# Patient Record
Sex: Male | Born: 1940
Health system: Southern US, Community
[De-identification: ages and names within clinical notes are randomized; demographics above are authoritative.]

## PROBLEM LIST (undated history)

## (undated) DIAGNOSIS — E213 Hyperparathyroidism, unspecified: Secondary | ICD-10-CM

## (undated) DIAGNOSIS — J189 Pneumonia, unspecified organism: Secondary | ICD-10-CM

## (undated) DIAGNOSIS — I2510 Atherosclerotic heart disease of native coronary artery without angina pectoris: Secondary | ICD-10-CM

## (undated) DIAGNOSIS — R0600 Dyspnea, unspecified: Secondary | ICD-10-CM

## (undated) DIAGNOSIS — C61 Malignant neoplasm of prostate: Secondary | ICD-10-CM

## (undated) DIAGNOSIS — M1990 Unspecified osteoarthritis, unspecified site: Secondary | ICD-10-CM

## (undated) DIAGNOSIS — I209 Angina pectoris, unspecified: Secondary | ICD-10-CM

## (undated) DIAGNOSIS — H409 Unspecified glaucoma: Secondary | ICD-10-CM

## (undated) DIAGNOSIS — B54 Unspecified malaria: Secondary | ICD-10-CM

## (undated) DIAGNOSIS — I10 Essential (primary) hypertension: Secondary | ICD-10-CM

## (undated) DIAGNOSIS — I499 Cardiac arrhythmia, unspecified: Secondary | ICD-10-CM

## (undated) DIAGNOSIS — R0609 Other forms of dyspnea: Secondary | ICD-10-CM

## (undated) DIAGNOSIS — K219 Gastro-esophageal reflux disease without esophagitis: Secondary | ICD-10-CM

## (undated) DIAGNOSIS — N189 Chronic kidney disease, unspecified: Secondary | ICD-10-CM

## (undated) HISTORY — PX: MOHS SURGERY: SUR867

## (undated) HISTORY — PX: OTHER SURGICAL HISTORY: SHX169

## (undated) HISTORY — PX: PROSTATE BIOPSY: SHX241

---

## 1967-05-21 DIAGNOSIS — B54 Unspecified malaria: Secondary | ICD-10-CM

## 1967-05-21 HISTORY — DX: Unspecified malaria: B54

## 1980-05-20 HISTORY — PX: VASECTOMY: SHX75

## 2000-06-28 ENCOUNTER — Encounter: Admission: RE | Admit: 2000-06-28 | Discharge: 2000-06-28 | Payer: Self-pay | Admitting: Emergency Medicine

## 2000-06-28 ENCOUNTER — Encounter: Payer: Self-pay | Admitting: Emergency Medicine

## 2004-08-03 ENCOUNTER — Encounter: Admission: RE | Admit: 2004-08-03 | Discharge: 2004-08-03 | Payer: Self-pay | Admitting: Emergency Medicine

## 2005-08-28 ENCOUNTER — Encounter: Admission: RE | Admit: 2005-08-28 | Discharge: 2005-08-28 | Payer: Self-pay | Admitting: Emergency Medicine

## 2008-05-20 DIAGNOSIS — C61 Malignant neoplasm of prostate: Secondary | ICD-10-CM

## 2008-05-20 HISTORY — DX: Malignant neoplasm of prostate: C61

## 2011-09-18 HISTORY — PX: OTHER SURGICAL HISTORY: SHX169

## 2011-09-25 DIAGNOSIS — E7800 Pure hypercholesterolemia, unspecified: Secondary | ICD-10-CM | POA: Diagnosis not present

## 2011-09-25 DIAGNOSIS — R079 Chest pain, unspecified: Secondary | ICD-10-CM | POA: Diagnosis not present

## 2011-09-25 DIAGNOSIS — R002 Palpitations: Secondary | ICD-10-CM | POA: Diagnosis not present

## 2011-09-25 DIAGNOSIS — I10 Essential (primary) hypertension: Secondary | ICD-10-CM | POA: Diagnosis not present

## 2011-10-09 DIAGNOSIS — R9430 Abnormal result of cardiovascular function study, unspecified: Secondary | ICD-10-CM | POA: Diagnosis not present

## 2011-10-15 ENCOUNTER — Other Ambulatory Visit: Payer: Self-pay | Admitting: Cardiovascular Disease

## 2011-10-17 ENCOUNTER — Other Ambulatory Visit: Payer: Self-pay | Admitting: Cardiovascular Disease

## 2011-10-17 ENCOUNTER — Ambulatory Visit
Admission: RE | Admit: 2011-10-17 | Discharge: 2011-10-17 | Disposition: A | Discharge status: Home / Self Care | Payer: Medicare Other | Source: Ambulatory Visit | Attending: Cardiovascular Disease | Admitting: Cardiovascular Disease

## 2011-10-17 ENCOUNTER — Encounter (HOSPITAL_COMMUNITY): Payer: Self-pay | Admitting: Pharmacy Technician

## 2011-10-17 DIAGNOSIS — R5383 Other fatigue: Secondary | ICD-10-CM | POA: Diagnosis not present

## 2011-10-17 DIAGNOSIS — Z01818 Encounter for other preprocedural examination: Secondary | ICD-10-CM

## 2011-10-17 DIAGNOSIS — D689 Coagulation defect, unspecified: Secondary | ICD-10-CM | POA: Diagnosis not present

## 2011-10-17 DIAGNOSIS — Z01811 Encounter for preprocedural respiratory examination: Secondary | ICD-10-CM | POA: Diagnosis not present

## 2011-10-17 DIAGNOSIS — R0789 Other chest pain: Secondary | ICD-10-CM | POA: Diagnosis not present

## 2011-10-17 DIAGNOSIS — Z79899 Other long term (current) drug therapy: Secondary | ICD-10-CM | POA: Diagnosis not present

## 2011-10-17 DIAGNOSIS — I517 Cardiomegaly: Secondary | ICD-10-CM | POA: Diagnosis not present

## 2011-10-23 ENCOUNTER — Encounter (HOSPITAL_COMMUNITY)
Admission: RE | Disposition: A | Discharge status: Home / Self Care | Payer: Self-pay | Source: Ambulatory Visit | Attending: Cardiovascular Disease

## 2011-10-23 ENCOUNTER — Encounter (HOSPITAL_COMMUNITY): Payer: Self-pay | Admitting: General Practice

## 2011-10-23 ENCOUNTER — Ambulatory Visit (HOSPITAL_COMMUNITY)
Admission: RE | Admit: 2011-10-23 | Discharge: 2011-10-24 | Disposition: A | Discharge status: Home / Self Care | Payer: Medicare Other | Source: Ambulatory Visit | Attending: Cardiovascular Disease | Admitting: Cardiovascular Disease

## 2011-10-23 DIAGNOSIS — R9430 Abnormal result of cardiovascular function study, unspecified: Secondary | ICD-10-CM | POA: Diagnosis not present

## 2011-10-23 DIAGNOSIS — Z9861 Coronary angioplasty status: Secondary | ICD-10-CM | POA: Diagnosis not present

## 2011-10-23 DIAGNOSIS — I200 Unstable angina: Secondary | ICD-10-CM | POA: Diagnosis present

## 2011-10-23 DIAGNOSIS — N400 Enlarged prostate without lower urinary tract symptoms: Secondary | ICD-10-CM | POA: Diagnosis present

## 2011-10-23 DIAGNOSIS — Z95820 Peripheral vascular angioplasty status with implants and grafts: Secondary | ICD-10-CM

## 2011-10-23 DIAGNOSIS — I2510 Atherosclerotic heart disease of native coronary artery without angina pectoris: Secondary | ICD-10-CM | POA: Diagnosis present

## 2011-10-23 DIAGNOSIS — E785 Hyperlipidemia, unspecified: Secondary | ICD-10-CM | POA: Diagnosis present

## 2011-10-23 DIAGNOSIS — I209 Angina pectoris, unspecified: Secondary | ICD-10-CM | POA: Diagnosis not present

## 2011-10-23 DIAGNOSIS — I10 Essential (primary) hypertension: Secondary | ICD-10-CM | POA: Diagnosis present

## 2011-10-23 DIAGNOSIS — R931 Abnormal findings on diagnostic imaging of heart and coronary circulation: Secondary | ICD-10-CM | POA: Diagnosis present

## 2011-10-23 HISTORY — DX: Other forms of dyspnea: R06.09

## 2011-10-23 HISTORY — DX: Pneumonia, unspecified organism: J18.9

## 2011-10-23 HISTORY — PX: CORONARY ANGIOPLASTY WITH STENT PLACEMENT: SHX49

## 2011-10-23 HISTORY — DX: Angina pectoris, unspecified: I20.9

## 2011-10-23 HISTORY — DX: Atherosclerotic heart disease of native coronary artery without angina pectoris: I25.10

## 2011-10-23 HISTORY — DX: Dyspnea, unspecified: R06.00

## 2011-10-23 HISTORY — DX: Unspecified glaucoma: H40.9

## 2011-10-23 HISTORY — DX: Essential (primary) hypertension: I10

## 2011-10-23 HISTORY — DX: Hyperparathyroidism, unspecified: E21.3

## 2011-10-23 HISTORY — DX: Malignant neoplasm of prostate: C61

## 2011-10-23 HISTORY — PX: LEFT HEART CATHETERIZATION WITH CORONARY ANGIOGRAM: SHX5451

## 2011-10-23 HISTORY — DX: Unspecified malaria: B54

## 2011-10-23 SURGERY — LEFT HEART CATHETERIZATION WITH CORONARY ANGIOGRAM
Anesthesia: LOCAL

## 2011-10-23 MED ORDER — HYDROCORTISONE 1 % EX CREA
TOPICAL_CREAM | Freq: Four times a day (QID) | CUTANEOUS | Status: DC
  Administered 2011-10-23 – 2011-10-24 (×3): via TOPICAL
  Filled 2011-10-23 (×2): qty 28

## 2011-10-23 MED ORDER — AMLODIPINE BESYLATE 2.5 MG PO TABS: 2.5000 mg | ORAL_TABLET | Freq: Every day | ORAL | Status: DC

## 2011-10-23 MED ORDER — SODIUM CHLORIDE 0.9 % IV SOLN: 250.0000 mL | INTRAVENOUS | Status: DC

## 2011-10-23 MED ORDER — BIVALIRUDIN 250 MG IV SOLR
INTRAVENOUS | Status: AC
  Filled 2011-10-23: qty 250

## 2011-10-23 MED ORDER — METOPROLOL TARTRATE 25 MG PO TABS
25.0000 mg | ORAL_TABLET | Freq: Two times a day (BID) | ORAL | Status: DC
  Administered 2011-10-23 – 2011-10-24 (×2): 25 mg via ORAL
  Filled 2011-10-23 (×3): qty 1

## 2011-10-23 MED ORDER — SODIUM CHLORIDE 0.9 % IV SOLN
INTRAVENOUS | Status: DC
  Administered 2011-10-23: 09:00:00 via INTRAVENOUS

## 2011-10-23 MED ORDER — ATORVASTATIN CALCIUM 40 MG PO TABS
40.0000 mg | ORAL_TABLET | Freq: Every day | ORAL | Status: DC
  Administered 2011-10-23: 40 mg via ORAL
  Filled 2011-10-23 (×3): qty 1

## 2011-10-23 MED ORDER — DIPHENHYDRAMINE HCL 25 MG PO CAPS
25.0000 mg | ORAL_CAPSULE | Freq: Four times a day (QID) | ORAL | Status: DC | PRN
  Administered 2011-10-24: 25 mg via ORAL
  Filled 2011-10-23: qty 1

## 2011-10-23 MED ORDER — SODIUM CHLORIDE 0.9 % IJ SOLN: 3.0000 mL | INTRAMUSCULAR | Status: DC | PRN

## 2011-10-23 MED ORDER — LIDOCAINE HCL (PF) 1 % IJ SOLN
INTRAMUSCULAR | Status: AC
  Filled 2011-10-23: qty 30

## 2011-10-23 MED ORDER — TRAMADOL HCL 50 MG PO TABS
50.0000 mg | ORAL_TABLET | Freq: Four times a day (QID) | ORAL | Status: DC | PRN
  Filled 2011-10-23: qty 1

## 2011-10-23 MED ORDER — TICAGRELOR 90 MG PO TABS
ORAL_TABLET | ORAL | Status: AC
  Filled 2011-10-23: qty 1

## 2011-10-23 MED ORDER — SODIUM CHLORIDE 0.9 % IV SOLN
1.0000 mL/kg/h | INTRAVENOUS | Status: AC
  Administered 2011-10-23: 1 mL/kg/h via INTRAVENOUS

## 2011-10-23 MED ORDER — CLONIDINE HCL 0.1 MG PO TABS
0.1000 mg | ORAL_TABLET | Freq: Once | ORAL | Status: AC
  Administered 2011-10-23: 0.1 mg via ORAL
  Filled 2011-10-23 (×2): qty 1

## 2011-10-23 MED ORDER — FINASTERIDE 5 MG PO TABS
5.0000 mg | ORAL_TABLET | Freq: Every day | ORAL | Status: DC
  Administered 2011-10-24: 5 mg via ORAL
  Filled 2011-10-23: qty 1

## 2011-10-23 MED ORDER — ALPRAZOLAM 0.25 MG PO TABS
0.2500 mg | ORAL_TABLET | Freq: Three times a day (TID) | ORAL | Status: DC | PRN
Start: 2011-10-23 — End: 2011-10-24

## 2011-10-23 MED ORDER — LATANOPROST 0.005 % OP SOLN
1.0000 [drp] | Freq: Every day | OPHTHALMIC | Status: DC
  Administered 2011-10-23: 1 [drp] via OPHTHALMIC
  Filled 2011-10-23: qty 2.5

## 2011-10-23 MED ORDER — AMLODIPINE BESYLATE 5 MG PO TABS
5.0000 mg | ORAL_TABLET | Freq: Every day | ORAL | Status: DC
  Administered 2011-10-24: 5 mg via ORAL
  Filled 2011-10-23: qty 1

## 2011-10-23 MED ORDER — HEPARIN (PORCINE) IN NACL 2-0.9 UNIT/ML-% IJ SOLN
INTRAMUSCULAR | Status: AC
  Filled 2011-10-23: qty 1000

## 2011-10-23 MED ORDER — ASPIRIN 81 MG PO CHEW
81.0000 mg | CHEWABLE_TABLET | Freq: Every day | ORAL | Status: DC
  Administered 2011-10-24: 81 mg via ORAL
  Filled 2011-10-23: qty 1

## 2011-10-23 MED ORDER — SODIUM CHLORIDE 0.9 % IJ SOLN
3.0000 mL | Freq: Two times a day (BID) | INTRAMUSCULAR | Status: DC
  Administered 2011-10-23: 3 mL via INTRAVENOUS

## 2011-10-23 MED ORDER — MORPHINE SULFATE 2 MG/ML IJ SOLN: 2.0000 mg | INTRAMUSCULAR | Status: DC | PRN

## 2011-10-23 MED ORDER — TICAGRELOR 90 MG PO TABS
90.0000 mg | ORAL_TABLET | Freq: Two times a day (BID) | ORAL | Status: DC
  Administered 2011-10-24: 90 mg via ORAL
  Filled 2011-10-23 (×2): qty 1

## 2011-10-23 MED ORDER — METHYLPREDNISOLONE SODIUM SUCC 125 MG IJ SOLR
INTRAMUSCULAR | Status: AC
  Filled 2011-10-23: qty 2

## 2011-10-23 MED ORDER — ZOLPIDEM TARTRATE 5 MG PO TABS: 5.0000 mg | ORAL_TABLET | Freq: Every evening | ORAL | Status: DC | PRN

## 2011-10-23 MED ORDER — ONDANSETRON HCL 4 MG/2ML IJ SOLN: 4.0000 mg | Freq: Four times a day (QID) | INTRAMUSCULAR | Status: DC | PRN

## 2011-10-23 MED ORDER — METHYLPREDNISOLONE SODIUM SUCC 125 MG IJ SOLR
125.0000 mg | Freq: Once | INTRAMUSCULAR | Status: AC
  Administered 2011-10-23: 125 mg via INTRAVENOUS

## 2011-10-23 MED ORDER — ACETAMINOPHEN 325 MG PO TABS: 650.0000 mg | ORAL_TABLET | ORAL | Status: DC | PRN

## 2011-10-23 MED ORDER — TAMSULOSIN HCL 0.4 MG PO CAPS
0.4000 mg | ORAL_CAPSULE | Freq: Every day | ORAL | Status: DC
  Filled 2011-10-23: qty 1

## 2011-10-23 MED ORDER — NITROGLYCERIN 0.2 MG/ML ON CALL CATH LAB
INTRAVENOUS | Status: AC
  Filled 2011-10-23: qty 1

## 2011-10-23 NOTE — CV Procedure (Signed)
[facility]  PERCUTANEOUS CORONARY INTERVENTION PROCEDURE  NAME:  Wesley Johnston   MRN: [id number] DOB:  [birth year]   ADMIT DATE: 10/23/2011  Procedure Performed:  Percutaneous coronary Intervention on the Ostial RPDA (PDA/RPAV bifurcation 0,1,0) lesion with an Integrity Resolute DES 2.75 mm x 12 mm; post dilated tapering from 3.1 proximally to 2.85 distally.  Intracoronary Nitroglycerin Injection  Wesley Johnston is a 71 y/o gentleman with HTN who was referred for cardiac catheterization after being seen by Dr. Royann Shivers in the Outpatient clinic for SSx consistent with progressive angina.   He underwent diagnostic catheterization earlier this AM that demonstrated a ~80-90% ostial RPDA lesion.  After reviewing the initial cineangiography images, the culprit lesion(s) was identified, and the decision was made to proceed with percutaneous coronary intervention.  The 5Fr Sheath was exchanged over a wire for a 6Fr sheath.   A weight based bolus of IV Angiomax was administered and the drip was continued until completion of the procedure.   An ACT of > 200 Sec was confirmed prior to advancing the Guidewire. Oral Ticagrelor 180mg  was administered.  The Guide catheters were advanced over a Long Exchange Safety-J-wire and used to engage the right Coronary Artery. Attempted using a 6Fr XBRC Guide - unsuccessful.  Changed to EZ Rad R Guide. Both the RPDA & RPAV-into RPL1 were wired for pre-dilation, the RPAV wire was removed before Stent inflation. Stent placement & post-dilation was extremely difficult due to significant cardiac "swinging" moving the deployment & guide catheter in & out.  Lesion #1:  Ostial RPDA  Pre-PCI Stenosis: 80-90 % Post-PCI Stenosis: 0 %     TIMI 3 flow       TIMI 3 flow  Guide Catheter: EZ Rad R Guide. Guidewire: PDA - BMW; RPAV - [place]  Pre-Dilitation Balloon: Emergency Monorail 2.5 mm x 8 mm   1st Inflation:  8 Atm for 30 Sec   2nd Inflation: 8   Atm for 50  Sec Scout angiography did not reveal evidence of dissection or perforation; there is TIMI 3 flow remaining in the RPAV  Stent: Integrity Resolute DES 2.75  mm x 12 mm   Deployment:  10  Atm for 30  Sec   2nd Inflation: 40  Atm for 45  Sec; final distal diameter 2.85 mm   Scout angiography did not reveal evidence of dissection or perforation.  Post-Dilitation Balloon: [place] Quantum Apex 3.0 mm x 10 mm    1st Inflation: 12 Atm for 45 Sec; distal stent    2nd Inflation: 60 Atm for  40 Sec; proximal we'll stent    200 g IC Nitroglycerin given Post deployment angiography with and without wire pleasure excellent stent apposition with no evidence of dissection or perforation and TIMI-3 flow was preserved in both RPDA and RPAV. The guide was removed the body or wire without complication.  The patient stable before, during, and after the procedure. EBL:   < 10 ml  TR Band:  1210 Hours, 10 mL air  MEDICATIONS:  Angiomax bolus and drip during PCI; Brilinta 180mg  loading dose  PATIENT DISPOSITION:    PACU holding area -- chest pain-free and hemodynamically stable.  POST-OPERATIVE DIAGNOSIS:    Successful single-vessel PCI of the bifurcation RPDA/RPAV (0, 1, 0 bifurcation lesion) to decrease the 80-90% ostial RPDA lesion to 0% stenosis.  PLAN OF CARE:  Overnight observation for standard post-radial cath care.  Dual antiplatelet therapy for minimum 1 year.  Will initiate beta blocker  and statin therapy prior to discharge.  Anticipate discharge the morning if stable followup with Dr. Royann Shivers.   Marykay Lex, M.D., M.S. [facility] [address] [place], [place]  [address]  [phone] Pager # [phone]  10/23/2011 12:46 PM

## 2011-10-23 NOTE — H&P (Signed)
  Date of Initial H&P: 10/17/2011  History reviewed, patient examined, no change in status, stable for surgery.  Thurmon Fair, MD, [facility] [phone] office [phone] pager' 10/23/2011 10:14 AM

## 2011-10-23 NOTE — Progress Notes (Signed)
TR BAND REMOVAL  LOCATION:  right radial  DEFLATED PER PROTOCOL:  yes  TIME BAND OFF / DRESSING APPLIED:   1600   SITE UPON ARRIVAL:   Level 0  SITE AFTER BAND REMOVAL:  Level 0  REVERSE ALLEN'S TEST:    positive  CIRCULATION SENSATION AND MOVEMENT:  Within Normal Limits  yes  COMMENTS:    

## 2011-10-23 NOTE — CV Procedure (Signed)
Wesley Johnston, Wesley Johnston Male, 71 y.o., 10/25/1940  Location: [facility]U  Bed: 2507-01  MRN: [id number]  CSN: [id number] CARDIAC CATHETERIZATION REPORT   Procedures performed:  1. Left heart catheterization  2. Selective coronary angiography   Reason for procedure:  Stable angina pectoris  Procedure performed by: Thurmon Fair, MD, [facility]  Complications: none   Estimated blood loss: less than 5 mL   History:  71 yo man with coronary risk factors and typical angina pectoris despite treatment with beta blockers and calcium channel blockers.  Consent: The risks, benefits, and details of the procedure were explained to the patient. Risks including death, MI, stroke, bleeding, limb ischemia, renal failure and allergy were described and accepted by the patient. Informed written consent was obtained prior to proceeding.  Technique: The patient was brought to the cardiac catheterization laboratory in the fasting state. He was prepped and draped in the usual sterile fashion. Local anesthesia with 1% lidocaine was administered to the right wrist area. Using the modified Seldinger technique a 5 French right radial artery sheath was introduced without difficulty. Under fluoroscopic guidance, using 5 French TIG and angled pigtail catheters, selective cannulation of the left coronary artery, right coronary artery and left ventricle were respectively performed. Several coronary angiograms in a variety of projections were recorded, as well as a left ventriculogram in the RAO projection. Left ventricular pressure and a pull back to the aorta were recorded. No immediate complications occurred. At the end of the procedure, all catheters were removed. After the procedure, hemostasis will be achieved with manual pressure.  Contrast used: 80 mL Omnipaque  Angiographic Findings:  1. The left main coronary artery is free of significant atherosclerotic irregularities and bifurcates in the usual fashion into the left  anterior descending artery and left circumflex coronary artery. Mild calcification is noted. 2. The left anterior descending artery is a large vessel that reaches the apex and generates  Three major diagonal branches. There is evidence of minimal luminal irregularities and no calcification. No hemodynamically meaningful stenoses are seen. 3. The left circumflex coronary artery is a large-size vessel non dominant vessel that generates three major oblque marginal arteries. There is evidence of minimal luminal irregularities and no calcification. No hemodynamically meaningful stenoses are seen. 4. The right coronary artery is a large-size dominant vessel that generates a large posterior lateral ventricular system as well as the posterior descending artery. There is evidence of some luminal irregularities and minimal calcification. There is a 85% ostial stenosis of the posterior descending artery. No other hemodynamically meaningful stenoses are seen.  5. The left ventricle was not injected. There is no aortic valve stenosis by pullback. The left ventricular end-diastolic pressure is 14 mm Hg.    IMPRESSIONS:  High grade PDA stenosis, symptomatic RECOMMENDATION:  PCI/stent of R-PDA.     Thurmon Fair, MD, [facility] [phone] office [phone] pager  10/23/2011 7:40 PM

## 2011-10-24 DIAGNOSIS — N400 Enlarged prostate without lower urinary tract symptoms: Secondary | ICD-10-CM | POA: Diagnosis present

## 2011-10-24 DIAGNOSIS — Z9861 Coronary angioplasty status: Secondary | ICD-10-CM | POA: Diagnosis not present

## 2011-10-24 DIAGNOSIS — I209 Angina pectoris, unspecified: Secondary | ICD-10-CM | POA: Diagnosis not present

## 2011-10-24 DIAGNOSIS — I200 Unstable angina: Secondary | ICD-10-CM | POA: Diagnosis present

## 2011-10-24 DIAGNOSIS — I2510 Atherosclerotic heart disease of native coronary artery without angina pectoris: Secondary | ICD-10-CM | POA: Diagnosis present

## 2011-10-24 DIAGNOSIS — E785 Hyperlipidemia, unspecified: Secondary | ICD-10-CM | POA: Diagnosis present

## 2011-10-24 DIAGNOSIS — I10 Essential (primary) hypertension: Secondary | ICD-10-CM | POA: Diagnosis present

## 2011-10-24 DIAGNOSIS — R931 Abnormal findings on diagnostic imaging of heart and coronary circulation: Secondary | ICD-10-CM | POA: Diagnosis present

## 2011-10-24 LAB — CBC
HCT: 42 % (ref 39.0–52.0)
Hemoglobin: 14.8 g/dL (ref 13.0–17.0)
MCH: 30.4 pg (ref 26.0–34.0)
RBC: 4.87 MIL/uL (ref 4.22–5.81)

## 2011-10-24 LAB — BASIC METABOLIC PANEL
BUN: 9 mg/dL (ref 6–23)
CO2: 21 mEq/L (ref 19–32)
Calcium: 10.6 mg/dL — ABNORMAL HIGH (ref 8.4–10.5)
Glucose, Bld: 155 mg/dL — ABNORMAL HIGH (ref 70–99)
Potassium: 3.9 mEq/L (ref 3.5–5.1)
Sodium: 136 mEq/L (ref 135–145)

## 2011-10-24 MED ORDER — ATORVASTATIN CALCIUM 40 MG PO TABS: 40.0000 mg | ORAL_TABLET | Freq: Every day | ORAL | Status: DC

## 2011-10-24 MED ORDER — TICAGRELOR 90 MG PO TABS: 90.0000 mg | ORAL_TABLET | Freq: Two times a day (BID) | ORAL | Status: DC

## 2011-10-24 MED ORDER — ASPIRIN 81 MG PO CHEW: 81.0000 mg | CHEWABLE_TABLET | Freq: Every day | ORAL | Status: AC

## 2011-10-24 MED ORDER — METOPROLOL TARTRATE 25 MG PO TABS: 25.0000 mg | ORAL_TABLET | Freq: Two times a day (BID) | ORAL | Status: DC

## 2011-10-24 MED ORDER — NITROGLYCERIN 0.4 MG SL SUBL: 0.4000 mg | SUBLINGUAL_TABLET | SUBLINGUAL | Status: DC | PRN

## 2011-10-24 MED FILL — Nicardipine HCl IV Soln 2.5 MG/ML: INTRAVENOUS | Qty: 1 | Status: AC

## 2011-10-24 NOTE — Progress Notes (Addendum)
Patient ID: Wesley Johnston,  MRN: [id number], DOB/AGE: 69-Feb-1942 69 y.o.  Admit date: 10/23/2011 Discharge date: 10/24/2011  [facility] Primary Cardiologist: Dr Royann Shivers  Discharge Diagnoses Principal Problem:  *Unstable angina Active Problems:  Abnormal nuclear cardiac imaging test  CAD- PDA DES 10/23/11  HTN (hypertension)  Dyslipidemia, LDL 170 Oct 2012  BPH (benign prostatic hyperplasia)    Procedures: Cath/ PDA DES 10/23/11   Hospital Course: 71 y/o male with a history of prostate cancer, on full disability and followed at the [place]. He was seen by Dr Royann Shivers in April with complaints of fatigue and chest pain. Myoview was abnormal. He was admitted for diagnostic cath 10/23/11. This revealed a PDA lesion that Dr Herbie Baltimore dilated and stented with a DES. He tolerated this well and is discharged 10/24/11.  Discharge Vitals:  Blood pressure 134/66, pulse 67, temperature 97.7 F (36.5 C), temperature source Oral, resp. rate 18, height 6\' 1"  (1.854 m), weight 101.4 kg (223 lb 8.7 oz), SpO2 97.00%.    Labs: Results for orders placed during the hospital encounter of 10/23/11 (from the past 48 hour(s))  POCT ACTIVATED CLOTTING TIME     Status: Normal   Collection Time   10/23/11 11:40 AM      Component Value Range Comment   Activated Clotting Time 391     CBC     Status: Abnormal   Collection Time   10/24/11  3:58 AM      Component Value Range Comment   WBC 11.4 (*) 4.0 - 10.5 (K/uL)    RBC 4.87  4.22 - 5.81 (MIL/uL)    Hemoglobin 14.8  13.0 - 17.0 (g/dL)    HCT 14.7  82.9 - 56.2 (%)    MCV 86.2  78.0 - 100.0 (fL)    MCH 30.4  26.0 - 34.0 (pg)    MCHC 35.2  30.0 - 36.0 (g/dL)    RDW 13.0  86.5 - 78.4 (%)    Platelets 261  150 - 400 (K/uL)   BASIC METABOLIC PANEL     Status: Abnormal   Collection Time   10/24/11  3:58 AM      Component Value Range Comment   Sodium 136  135 - 145 (mEq/L)    Potassium 3.9  3.5 - 5.1 (mEq/L)    Chloride 104  96 - 112 (mEq/L)    CO2 21  19 -  32 (mEq/L)    Glucose, Bld 155 (*) 70 - 99 (mg/dL)    BUN 9  6 - 23 (mg/dL)    Creatinine, Ser 6.96  0.50 - 1.35 (mg/dL)    Calcium 29.5 (*) 8.4 - 10.5 (mg/dL)    GFR calc non Af Amer >90  >90 (mL/min)    GFR calc Af Amer >90  >90 (mL/min)     Disposition:  Follow-up Information    Follow up with Abelino Derrick, PA. (office will call)    Contact information:   [address] [place] [address] [phone]          Discharge Medications:  Medication List  As of 10/24/2011  9:32 AM   STOP taking these medications         aspirin 325 MG tablet         TAKE these medications         acetaminophen 325 MG tablet   Commonly known as: TYLENOL   Take 650 mg by mouth every 6 (six) hours as needed. For  pain      amLODipine 5 MG tablet   Commonly known as: NORVASC   Take 2.5 mg by mouth daily.      aspirin 81 MG chewable tablet   Chew 1 tablet (81 mg total) by mouth daily.      atorvastatin 40 MG tablet   Commonly known as: LIPITOR   Take 1 tablet (40 mg total) by mouth daily at 6 PM.      ergocalciferol 50000 UNITS capsule   Commonly known as: VITAMIN D2   Take 50,000 Units by mouth once a week.      finasteride 5 MG tablet   Commonly known as: PROSCAR   Take 5 mg by mouth daily.      ibuprofen 200 MG tablet   Commonly known as: ADVIL,MOTRIN   Take 200 mg by mouth every 6 (six) hours as needed. For pain      latanoprost 0.005 % ophthalmic solution   Commonly known as: XALATAN   Place 1 drop into both eyes at bedtime.      metoprolol tartrate 25 MG tablet   Commonly known as: LOPRESSOR   Take 1 tablet (25 mg total) by mouth 2 (two) times daily.      nitroGLYCERIN 0.4 MG SL tablet   Commonly known as: NITROSTAT   Place 1 tablet (0.4 mg total) under the tongue every 5 (five) minutes as needed for chest pain.      Tamsulosin HCl 0.4 MG Caps   Commonly known as: FLOMAX   Take 0.4 mg by mouth daily after supper.      Ticagrelor 90 MG  Tabs tablet   Commonly known as: BRILINTA   Take 1 tablet (90 mg total) by mouth 2 (two) times daily.            Outstanding Labs/Studies  Duration of Discharge Encounter: Greater than 30 minutes including physician time.  Jolene Provost PA-C 10/24/2011 9:32 AM

## 2011-10-24 NOTE — Progress Notes (Signed)
Subjective:  No CP/SOB  Objective:  Temp:  [97.2 F (36.2 C)-97.8 F (36.6 C)] 97.5 F (36.4 C) (06/06 0330) Pulse Rate:  [54-100] 63  (06/06 0500) Resp:  [16-21] 18  (06/06 0330) BP: (136-176)/(73-90) 136/80 mmHg (06/06 0330) SpO2:  [93 %-96 %] 95 % (06/06 0500) Weight:  [98.884 kg (218 lb)-101.4 kg (223 lb 8.7 oz)] 101.4 kg (223 lb 8.7 oz) (06/06 0033) Weight change:   Intake/Output from previous day: 06/05 0701 - 06/06 0700 In: 1011.2 [P.O.:220; I.V.:791.2] Out: -   Intake/Output from this shift:    Physical Exam: General appearance: alert, cooperative and appears stated age Neck: no adenopathy, no carotid bruit, no JVD, supple, symmetrical, trachea midline and thyroid not enlarged, symmetric, no tenderness/mass/nodules Lungs: clear to auscultation bilaterally Heart: regular rate and rhythm, S1, S2 normal, no murmur, click, rub or gallop Extremities: extremities normal, atraumatic, no cyanosis or edema and right radial puncture site bandaged  Lab Results: Results for orders placed during the hospital encounter of 10/23/11 (from the past 48 hour(s))  POCT ACTIVATED CLOTTING TIME     Status: Normal   Collection Time   10/23/11 11:40 AM      Component Value Range Comment   Activated Clotting Time 391     CBC     Status: Abnormal   Collection Time   10/24/11  3:58 AM      Component Value Range Comment   WBC 11.4 (*) 4.0 - 10.5 (K/uL)    RBC 4.87  4.22 - 5.81 (MIL/uL)    Hemoglobin 14.8  13.0 - 17.0 (g/dL)    HCT 78.2  95.6 - 21.3 (%)    MCV 86.2  78.0 - 100.0 (fL)    MCH 30.4  26.0 - 34.0 (pg)    MCHC 35.2  30.0 - 36.0 (g/dL)    RDW 08.6  57.8 - 46.9 (%)    Platelets 261  150 - 400 (K/uL)   BASIC METABOLIC PANEL     Status: Abnormal   Collection Time   10/24/11  3:58 AM      Component Value Range Comment   Sodium 136  135 - 145 (mEq/L)    Potassium 3.9  3.5 - 5.1 (mEq/L)    Chloride 104  96 - 112 (mEq/L)    CO2 21  19 - 32 (mEq/L)    Glucose, Bld 155 (*) 70 - 99  (mg/dL)    BUN 9  6 - 23 (mg/dL)    Creatinine, Ser 6.29  0.50 - 1.35 (mg/dL)    Calcium 52.8 (*) 8.4 - 10.5 (mg/dL)    GFR calc non Af Amer >90  >90 (mL/min)    GFR calc Af Amer >90  >90 (mL/min)     Imaging: Imaging results have been reviewed  Assessment/Plan:   1. Active Problems: 2.  * No active hospital problems. *  3.   Time Spent Directly with Patient:  20 minutes  Length of Stay:  LOS: 1 day   S/P elective PDA PCI/Stent with DES by Dr. Dwaine Deter. No CP/SOB. Exam benign. Labs OK. D/C home on ASA and Brilenta. ROV with Dr. Salena Saner 1-2 weeks.  Runell Gess 10/24/2011, 8:17 AM

## 2011-10-24 NOTE — Care Management Note (Signed)
    Page 1 of 1   10/24/2011     8:34:49 AM   CARE MANAGEMENT NOTE 10/24/2011  Patient:  Wesley Johnston, Wesley Johnston   Account Number:  [id number]  Date Initiated:  10/24/2011  Documentation initiated by:  SIMMONS,Altie Savard  Subjective/Objective Assessment:   RECEIVED REFERRAL FOR BRILINTA ASSISTANCE.     Action/Plan:   PROVIDED PT WITH ASSISTANCE CARD WITH INSTRUCTIONS; PT STATED [facility].   Anticipated DC Date:  10/24/2011   Anticipated DC Plan:  HOME/SELF CARE      DC Planning Services  CM consult  Medication Assistance      Choice offered to / List presented to:             Status of service:  Completed, signed off Medicare Important Message given?   (If response is "NO", the following Medicare IM given date fields will be blank) Date Medicare IM given:   Date Additional Medicare IM given:    Discharge Disposition:  HOME/SELF CARE  Per UR Regulation:  Reviewed for med. necessity/level of care/duration of stay  If discussed at Long Length of Stay Meetings, dates discussed:    Comments:  10/24/11  0830  Delona Clasby SIMMONS RN, BSN [phone]

## 2011-10-24 NOTE — Discharge Instructions (Signed)
Angiogram, Angioplasty, or Stent Placement Care After One of the following procedures was done today. ANGIOGRAM: A catheter was placed through the blood vessel in your groin, contrast was injected into the vessels, and pictures were taken. ANGIOPLASTY: A catheter was placed through the blood vessel in your groin and directed to an area of blocked blood flow. A balloon, and possibly a metal stent were used to open the blockage. If no other blockages are present below this area, your symptoms should improve. If blockages are present below this area, surgery may still be necessary. STENT: A catheter was placed in your groin through which a metal mesh tube was placed in a narrowed part of the artery to facilitate blood flow. You were given intravenous sedation. These medications are rapidly cleared from your bloodstream. You may feel some discomfort at the insertion site after the local anesthetic wears off. This discomfort should gradually improve over the next several days.  Only take over-the-counter or prescription medicines for pain, discomfort, or fever as directed by your caregiver.   Complications are very uncommon after this procedure. Go to the nearest emergency department if you develop any of the following symptoms:   Worsening pain.   Bleeding.   Swelling at the puncture site.   Lightheadedness.   Dizziness or fainting.   Fever or chills.   If oozing, bleeding, or a lump appears at the puncture site, apply firm pressure directly to the site steadily for 15 minutes and go to the emergency department.   Keep the skin around the insertion site dry. You may take showers after 24 hours. If the area does get wet, dry the skin completely. Avoid baths until the skin puncture site heals, usually 5 to 7 days.   Development of redness, increased soreness, or swelling may be signs of a skin infection. Contact your physician.   Rest for the remainder of the day and avoid any heavy  lifting (more than 10 pounds or 4.5 kg). Do not operate heavy machinery, drive, or make legal decisions for the first 24 hours after the procedure. Have a responsible person drive you home.   You may resume your usual diet after the procedure. Avoid alcoholic beverages for 24 hours after the procedure.  Document Released: 05/06/2005 Document Revised: 04/25/2011 Document Reviewed: 03/05/2006 [employer] Patient Information 2012 [employer]. 

## 2011-10-24 NOTE — Progress Notes (Signed)
CARDIAC REHAB PHASE I   PRE:  Rate/Rhythm: 75 SR  BP:  Supine:   Sitting: 134/66  Standing:    SaO2:   MODE:  Ambulation: 680 ft   POST:  Rate/Rhythem: 88 SR  BP:  Supine:   Sitting: 138/67  Standing:    SaO2:  0830-0945 Tolerated ambulation well without c/o of cp or SOB. VS stable. Completed discharge education with pt. He agrees to [employer]. CRP in [place] or [place], he will talk with both to decide which one.  Beatrix Fetters

## 2011-10-25 MED FILL — Dextrose Inj 5%: INTRAVENOUS | Qty: 50 | Status: AC

## 2011-10-27 ENCOUNTER — Ambulatory Visit (INDEPENDENT_AMBULATORY_CARE_PROVIDER_SITE_OTHER): Payer: Medicare Other | Admitting: Family Medicine

## 2011-10-27 VITALS — BP 120/67 | HR 75 | Temp 97.5°F | Resp 16 | Ht 72.0 in | Wt 219.0 lb

## 2011-10-27 DIAGNOSIS — Z79899 Other long term (current) drug therapy: Secondary | ICD-10-CM | POA: Diagnosis not present

## 2011-10-27 DIAGNOSIS — L02219 Cutaneous abscess of trunk, unspecified: Secondary | ICD-10-CM

## 2011-10-27 DIAGNOSIS — L03311 Cellulitis of abdominal wall: Secondary | ICD-10-CM

## 2011-10-27 DIAGNOSIS — L259 Unspecified contact dermatitis, unspecified cause: Secondary | ICD-10-CM

## 2011-10-27 DIAGNOSIS — L255 Unspecified contact dermatitis due to plants, except food: Secondary | ICD-10-CM

## 2011-10-27 MED ORDER — DOXYCYCLINE HYCLATE 100 MG PO TABS: 100.0000 mg | ORAL_TABLET | Freq: Two times a day (BID) | ORAL | Status: AC

## 2011-10-27 MED ORDER — PREDNISONE 20 MG PO TABS: ORAL_TABLET | ORAL | Status: DC

## 2011-10-27 NOTE — Patient Instructions (Addendum)
Contact Dermatitis Contact dermatitis is a reaction to certain substances that touch the skin. Contact dermatitis can be either irritant contact dermatitis or allergic contact dermatitis. Irritant contact dermatitis does not require previous exposure to the substance for a reaction to occur.Allergic contact dermatitis only occurs if you have been exposed to the substance before. Upon a repeat exposure, your body reacts to the substance.  CAUSES  Many substances can cause contact dermatitis. Irritant dermatitis is most commonly caused by repeated exposure to mildly irritating substances, such as:  Makeup.   Soaps.   Detergents.   Bleaches.   Acids.   Metal salts, such as nickel.  Allergic contact dermatitis is most commonly caused by exposure to:  Poisonous plants.   Chemicals (deodorants, shampoos).   Jewelry.   Latex.   Neomycin in triple antibiotic cream.   Preservatives in products, including clothing.  SYMPTOMS  The area of skin that is exposed may develop:  Dryness or flaking.   Redness.   Cracks.   Itching.   Pain or a burning sensation.   Blisters.  With allergic contact dermatitis, there may also be swelling in areas such as the eyelids, mouth, or genitals.  DIAGNOSIS  Your caregiver can usually tell what the problem is by doing a physical exam. In cases where the cause is uncertain and an allergic contact dermatitis is suspected, a patch skin test may be performed to help determine the cause of your dermatitis. TREATMENT Treatment includes protecting the skin from further contact with the irritating substance by avoiding that substance if possible. Barrier creams, powders, and gloves may be helpful. Your caregiver may also recommend:  Steroid creams or ointments applied 2 times daily. For best results, soak the rash area in cool water for 20 minutes. Then apply the medicine. Cover the area with a plastic wrap. You can store the steroid cream in the  refrigerator for a "chilly" effect on your rash. That may decrease itching. Oral steroid medicines may be needed in more severe cases.   Antibiotics or antibacterial ointments if a skin infection is present.   Antihistamine lotion or an antihistamine taken by mouth to ease itching.   Lubricants to keep moisture in your skin.   Burow's solution to reduce redness and soreness or to dry a weeping rash. Mix one packet or tablet of solution in 2 cups cool water. Dip a clean washcloth in the mixture, wring it out a bit, and put it on the affected area. Leave the cloth in place for 30 minutes. Do this as often as possible throughout the day.   Taking several cornstarch or baking soda baths daily if the area is too large to cover with a washcloth.  Harsh chemicals, such as alkalis or acids, can cause skin damage that is like a burn. You should flush your skin for 15 to 20 minutes with cold water after such an exposure. You should also seek immediate medical care after exposure. Bandages (dressings), antibiotics, and pain medicine may be needed for severely irritated skin.  HOME CARE INSTRUCTIONS  Avoid the substance that caused your reaction.   Keep the area of skin that is affected away from hot water, soap, sunlight, chemicals, acidic substances, or anything else that would irritate your skin.   Do not scratch the rash. Scratching may cause the rash to become infected.   You may take cool baths to help stop the itching.   Only take over-the-counter or prescription medicines as directed by your caregiver.     See your caregiver for follow-up care as directed to make sure your skin is healing properly.  SEEK MEDICAL CARE IF:   Your condition is not better after 3 days of treatment.   You seem to be getting worse.   You see signs of infection such as swelling, tenderness, redness, soreness, or warmth in the affected area.   You have any problems related to your medicines.  Document Released:  05/03/2000 Document Revised: 04/25/2011 Document Reviewed: 10/09/2010 [facility] Patient Information 2012 [place], [place].  Poison [employer] ivy is a inflammation of the skin (contact dermatitis) caused by touching the allergens on the leaves of the ivy plant following previous exposure to the plant. The rash usually appears 48 hours after exposure. The rash is usually bumps (papules) or blisters (vesicles) in a linear pattern. Depending on your own sensitivity, the rash may simply cause redness and itching, or it may also progress to blisters which may break open. These must be well cared for to prevent secondary bacterial (germ) infection, followed by scarring. Keep any open areas dry, clean, dressed, and covered with an antibacterial ointment if needed. The eyes may also get puffy. The puffiness is worst in the morning and gets better as the day progresses. This dermatitis usually heals without scarring, within 2 to 3 weeks without treatment. HOME CARE INSTRUCTIONS  Thoroughly wash with soap and water as soon as you have been exposed to poison ivy. You have about one half hour to remove the plant resin before it will cause the rash. This washing will destroy the oil or antigen on the skin that is causing, or will cause, the rash. Be sure to wash under your fingernails as any plant resin there will continue to spread the rash. Do not rub skin vigorously when washing affected area. Poison ivy cannot spread if no oil from the plant remains on your body. A rash that has progressed to weeping sores will not spread the rash unless you have not washed thoroughly. It is also important to wash any clothes you have been wearing as these may carry active allergens. The rash will return if you wear the unwashed clothing, even several days later. Avoidance of the plant in the future is the best measure. Poison ivy plant can be recognized by the number of leaves. Generally, poison ivy has three leaves with flowering  branches on a single stem. Diphenhydramine may be purchased over the counter and used as needed for itching. Do not drive with this medication if it makes you drowsy.Ask your caregiver about medication for children. SEEK MEDICAL CARE IF:  Open sores develop.   Redness spreads beyond area of rash.   You notice purulent (pus-like) discharge.   You have increased pain.   Other signs of infection develop (such as fever).  Document Released: 05/03/2000 Document Revised: 04/25/2011 Document Reviewed: 03/22/2009 [facility] Patient Information 2012 [place], [place].  Recheck in next 2-3 days if not improving - sooner or to the emergency room if worse.

## 2011-10-27 NOTE — Progress Notes (Signed)
  Subjective:    Patient ID: Wesley Johnston, male    DOB: 1940/09/30, 71 y.o.   MRN: [id number]  HPI Wesley Johnston is a 71 y.o. male Cardiac stent placed last week - Dr. Adonis Brook.  11 days ago - noticed area on left abdomen  - red itchy bumps, thought was chigger bites or poison ivy - spread to legs , then arms -  Present for past week.  No fever.  Tx:  Hydrocortisone 1% cream, Sarna, gold bond.  May have had solumedrol.  No hx of diabetes.  Last checked last week - ok?  Glucose 155   10/24/11  Solumedrol 125mg  given 10/23/11.  Review of Systems  Constitutional: Negative for fever and chills.  Skin:       Pruritus.       Objective:   Physical Exam  Constitutional: He is oriented to person, place, and time. He appears well-developed and well-nourished.  Pulmonary/Chest: Effort normal.  Neurological: He is alert and oriented to person, place, and time.  Skin: Skin is warm and dry. Rash noted. There is erythema.     Psychiatric: He has a normal mood and affect. His behavior is normal.   Results for orders placed in visit on 10/27/11  GLUCOSE, POCT (MANUAL RESULT ENTRY)      Component Value Range   POC Glucose 95  70 - 99 (mg/dl)       Assessment & Plan:  Wesley Johnston is a 71 y.o. male  Suspected poison ivy/contact dermatitis, with peripheral spread, and no relief with topical treatments. Start prednisone 20mg  - 3, 3, 2, 2, 2, 1, 1, 1, 1/2, 1/2 #16. - SED. Cover for possible secondary abdominal wall cellulitis with Doxycycline 100mg  BID x 10 days. See photo. Recheck in next 2-3 days if not improving - sooner or to the emergency room if worse.

## 2011-11-04 DIAGNOSIS — I2510 Atherosclerotic heart disease of native coronary artery without angina pectoris: Secondary | ICD-10-CM | POA: Diagnosis not present

## 2011-11-04 DIAGNOSIS — E782 Mixed hyperlipidemia: Secondary | ICD-10-CM | POA: Diagnosis not present

## 2011-11-18 HISTORY — PX: NM MYOCAR MULTIPLE W/SPECT: HXRAD626

## 2011-11-19 DIAGNOSIS — I2510 Atherosclerotic heart disease of native coronary artery without angina pectoris: Secondary | ICD-10-CM | POA: Diagnosis not present

## 2011-11-19 DIAGNOSIS — R079 Chest pain, unspecified: Secondary | ICD-10-CM | POA: Diagnosis not present

## 2011-11-22 ENCOUNTER — Telehealth (HOSPITAL_COMMUNITY): Payer: Self-pay | Admitting: Cardiac Rehabilitation

## 2011-11-22 DIAGNOSIS — I2510 Atherosclerotic heart disease of native coronary artery without angina pectoris: Secondary | ICD-10-CM | POA: Diagnosis not present

## 2011-11-22 DIAGNOSIS — R0602 Shortness of breath: Secondary | ICD-10-CM | POA: Diagnosis not present

## 2011-11-22 DIAGNOSIS — R079 Chest pain, unspecified: Secondary | ICD-10-CM | POA: Diagnosis not present

## 2011-11-22 DIAGNOSIS — Z95818 Presence of other cardiac implants and grafts: Secondary | ICD-10-CM | POA: Diagnosis not present

## 2011-11-29 DIAGNOSIS — E782 Mixed hyperlipidemia: Secondary | ICD-10-CM | POA: Diagnosis not present

## 2011-11-29 DIAGNOSIS — I2510 Atherosclerotic heart disease of native coronary artery without angina pectoris: Secondary | ICD-10-CM | POA: Diagnosis not present

## 2011-11-29 DIAGNOSIS — I10 Essential (primary) hypertension: Secondary | ICD-10-CM | POA: Diagnosis not present

## 2012-01-25 ENCOUNTER — Ambulatory Visit (INDEPENDENT_AMBULATORY_CARE_PROVIDER_SITE_OTHER): Payer: Medicare Other | Admitting: Emergency Medicine

## 2012-01-25 ENCOUNTER — Ambulatory Visit: Payer: Medicare Other

## 2012-01-25 VITALS — BP 150/85 | HR 70 | Temp 98.7°F | Resp 16 | Ht 71.5 in | Wt 222.0 lb

## 2012-01-25 DIAGNOSIS — J40 Bronchitis, not specified as acute or chronic: Secondary | ICD-10-CM | POA: Diagnosis not present

## 2012-01-25 DIAGNOSIS — R05 Cough: Secondary | ICD-10-CM

## 2012-01-25 MED ORDER — HYDROCOD POLST-CHLORPHEN POLST 10-8 MG/5ML PO LQCR: 5.0000 mL | Freq: Two times a day (BID) | ORAL | Status: DC | PRN

## 2012-01-25 MED ORDER — CEFDINIR 300 MG PO CAPS: ORAL_CAPSULE | ORAL | Status: DC

## 2012-01-25 NOTE — Progress Notes (Signed)
  Subjective:    Patient ID: Wesley Johnston, male    DOB: February 22, 1941, 71 y.o.   MRN: [id number]  HPI A pleasant 71 year old male presents with history of "sniffling" beginning a couple weeks ago; progressed to cough x 10 days, worse x 6 days; as week progressed increased cough at night, keeping patient up; cough non productive, no wheezing, no hx of asthma, not a smoker, never smoker, presently yellow nasal discharge, no facial pain, teeth do not hurt, sinus pressure when leans forward  Tried OTC Delsym, Nyquil  Pneumonia 12 years ago Received pneumovax Past Sinus surgery    Review of Systems patient had a stent this summer after experiencing an abnormal stress Cardiolite which resulted in catheterization he's done well since that time and is not on an ACE inhibitor Fluid behind left ear    Objective:   Physical Exam HEENT exam reveals fluid behind left TM. Neck is supple chest exam reveals no rales or rhonchi done no areas of dullness to percussion.  [facility] reading (PRIMARY) by  Dr.Daub mild cardiomegaly no acute disease Peak flow 450       Assessment & Plan:  Previous chest xray 10/17/11 go ahead and give Tussionex 1 teaspoon at bedtime. He's been on Mucinex twice a day to break up his mucus. Have placed him on Omnicef 300 twice a day . Marland Kitchen

## 2012-01-25 NOTE — Patient Instructions (Signed)
Take medication as instructed . Over-the-counter Mucinex and take twice a day. He had prescriptions for antibiotics and cough medicines.Bronchitis Bronchitis is the body's way of reacting to injury and/or infection (inflammation) of the bronchi. Bronchi are the air tubes that extend from the windpipe into the lungs. If the inflammation becomes severe, it may cause shortness of breath. CAUSES  Inflammation may be caused by:  A virus.   Germs (bacteria).   Dust.   Allergens.   Pollutants and many other irritants.  The cells lining the bronchial tree are covered with tiny hairs (cilia). These constantly beat upward, away from the lungs, toward the mouth. This keeps the lungs free of pollutants. When these cells become too irritated and are unable to do their job, mucus begins to develop. This causes the characteristic cough of bronchitis. The cough clears the lungs when the cilia are unable to do their job. Without either of these protective mechanisms, the mucus would settle in the lungs. Then you would develop pneumonia. Smoking is a common cause of bronchitis and can contribute to pneumonia. Stopping this habit is the single most important thing you can do to help yourself. TREATMENT   Your caregiver may prescribe an antibiotic if the cough is caused by bacteria. Also, medicines that open up your airways make it easier to breathe. Your caregiver may also recommend or prescribe an expectorant. It will loosen the mucus to be coughed up. Only take over-the-counter or prescription medicines for pain, discomfort, or fever as directed by your caregiver.   Removing whatever causes the problem (smoking, for example) is critical to preventing the problem from getting worse.   Cough suppressants may be prescribed for relief of cough symptoms.   Inhaled medicines may be prescribed to help with symptoms now and to help prevent problems from returning.   For those with recurrent (chronic) bronchitis,  there may be a need for steroid medicines.  SEEK IMMEDIATE MEDICAL CARE IF:   During treatment, you develop more pus-like mucus (purulent sputum).   You have a fever.   Your baby is older than 3 months with a rectal temperature of 102 F (38.9 C) or higher.   Your baby is 13 months old or younger with a rectal temperature of 100.4 F (38 C) or higher.   You become progressively more ill.   You have increased difficulty breathing, wheezing, or shortness of breath.  It is necessary to seek immediate medical care if you are elderly or sick from any other disease. MAKE SURE YOU:   Understand these instructions.   Will watch your condition.   Will get help right away if you are not doing well or get worse.  Document Released: 05/06/2005 Document Revised: 04/25/2011 Document Reviewed: 03/15/2008 [facility] Patient Information 2012 [place], [place].

## 2012-02-21 DIAGNOSIS — I2510 Atherosclerotic heart disease of native coronary artery without angina pectoris: Secondary | ICD-10-CM | POA: Diagnosis not present

## 2012-04-13 ENCOUNTER — Encounter (HOSPITAL_COMMUNITY): Payer: Self-pay | Admitting: Emergency Medicine

## 2012-04-13 ENCOUNTER — Emergency Department (HOSPITAL_COMMUNITY)
Admission: EM | Admit: 2012-04-13 | Discharge: 2012-04-13 | Disposition: A | Discharge status: Home / Self Care | Payer: Medicare Other | Attending: Internal Medicine | Admitting: Internal Medicine

## 2012-04-13 ENCOUNTER — Emergency Department (HOSPITAL_COMMUNITY): Payer: Medicare Other

## 2012-04-13 DIAGNOSIS — I10 Essential (primary) hypertension: Secondary | ICD-10-CM | POA: Diagnosis not present

## 2012-04-13 DIAGNOSIS — R079 Chest pain, unspecified: Secondary | ICD-10-CM | POA: Insufficient documentation

## 2012-04-13 DIAGNOSIS — I2510 Atherosclerotic heart disease of native coronary artery without angina pectoris: Secondary | ICD-10-CM | POA: Diagnosis present

## 2012-04-13 DIAGNOSIS — Z7982 Long term (current) use of aspirin: Secondary | ICD-10-CM | POA: Insufficient documentation

## 2012-04-13 DIAGNOSIS — Z8619 Personal history of other infectious and parasitic diseases: Secondary | ICD-10-CM | POA: Insufficient documentation

## 2012-04-13 DIAGNOSIS — E213 Hyperparathyroidism, unspecified: Secondary | ICD-10-CM | POA: Diagnosis not present

## 2012-04-13 DIAGNOSIS — R110 Nausea: Secondary | ICD-10-CM | POA: Diagnosis not present

## 2012-04-13 DIAGNOSIS — I200 Unstable angina: Secondary | ICD-10-CM | POA: Diagnosis present

## 2012-04-13 DIAGNOSIS — Z7902 Long term (current) use of antithrombotics/antiplatelets: Secondary | ICD-10-CM | POA: Insufficient documentation

## 2012-04-13 DIAGNOSIS — Z9861 Coronary angioplasty status: Secondary | ICD-10-CM | POA: Diagnosis not present

## 2012-04-13 DIAGNOSIS — Z8701 Personal history of pneumonia (recurrent): Secondary | ICD-10-CM | POA: Diagnosis not present

## 2012-04-13 DIAGNOSIS — Z79899 Other long term (current) drug therapy: Secondary | ICD-10-CM | POA: Insufficient documentation

## 2012-04-13 DIAGNOSIS — H409 Unspecified glaucoma: Secondary | ICD-10-CM | POA: Insufficient documentation

## 2012-04-13 DIAGNOSIS — E785 Hyperlipidemia, unspecified: Secondary | ICD-10-CM | POA: Diagnosis present

## 2012-04-13 DIAGNOSIS — N400 Enlarged prostate without lower urinary tract symptoms: Secondary | ICD-10-CM | POA: Diagnosis present

## 2012-04-13 DIAGNOSIS — Z8546 Personal history of malignant neoplasm of prostate: Secondary | ICD-10-CM | POA: Insufficient documentation

## 2012-04-13 LAB — CBC WITH DIFFERENTIAL/PLATELET
Eosinophils Relative: 3 % (ref 0–5)
HCT: 41.3 % (ref 39.0–52.0)
Lymphocytes Relative: 32 % (ref 12–46)
Lymphs Abs: 1.8 10*3/uL (ref 0.7–4.0)
MCV: 86.4 fL (ref 78.0–100.0)
Monocytes Absolute: 0.5 10*3/uL (ref 0.1–1.0)
RBC: 4.78 MIL/uL (ref 4.22–5.81)
WBC: 5.7 10*3/uL (ref 4.0–10.5)

## 2012-04-13 LAB — BASIC METABOLIC PANEL
BUN: 13 mg/dL (ref 6–23)
CO2: 25 mEq/L (ref 19–32)
Calcium: 11.5 mg/dL — ABNORMAL HIGH (ref 8.4–10.5)
Creatinine, Ser: 0.65 mg/dL (ref 0.50–1.35)
Glucose, Bld: 98 mg/dL (ref 70–99)

## 2012-04-13 MED ORDER — PANTOPRAZOLE SODIUM 40 MG PO TBEC: 40.0000 mg | DELAYED_RELEASE_TABLET | Freq: Every day | ORAL | Status: DC

## 2012-04-13 MED ORDER — MORPHINE SULFATE 4 MG/ML IJ SOLN
2.0000 mg | Freq: Once | INTRAMUSCULAR | Status: AC
  Administered 2012-04-13: 2 mg via INTRAVENOUS
  Filled 2012-04-13: qty 1

## 2012-04-13 MED ORDER — NITROGLYCERIN 0.4 MG SL SUBL: 0.4000 mg | SUBLINGUAL_TABLET | SUBLINGUAL | Status: DC | PRN

## 2012-04-13 MED ORDER — ASPIRIN 81 MG PO CHEW: 324.0000 mg | CHEWABLE_TABLET | Freq: Once | ORAL | Status: DC

## 2012-04-13 NOTE — ED Notes (Signed)
Pt c/o left sided CP starting yesterday with some nausea; pt sts hx of stent in past

## 2012-04-13 NOTE — H&P (Signed)
Pt. Seen and examined. Agree with the NP/PA-C note as written.  Pleasant 71 yo male with a history of abnormal bruce treadmill this past Spring. 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 He had a DES placed, however, he subsequently had the same chest pain he had prior to the stent. Work-up included a nuclear stress test in July of this year which was negative for ischemia. On Sunday he had similar pain as in the past, it was a dull ache in his left upper chest that came on while having his morning coffee (he drinks several cups of coffee daily and up to 4 vodka drinks/day).  It occurred again this morning and was associated with some nausea. He took a nitroglycerin without relief. EKG in the ER is unchanged. Troponin is zero. He does have a history of buldging disk in his neck which was treated in the past with traction.  IMP: Atypical chest pain, possibly related to GERD or cervical radiculopathy.  Recommend: I would recommend starting a daily PPI (omeprazole 20 mg) given his DAPT use and caffeine/etoh use. He could have further workup of his neck pain and difficulty reaching over his head.  There is nothing to suggest this is cardiac.  I also suspect that his original symptoms were not cardiac and that he possibly had a false positive treadmill and that his PDA lesion was "true-true" unrelated (since his symptoms have not improved with stenting).  Will arrange follow-up in our office with Dr. Salena Saner.  Thanks for consulting [place].  He can be safely discharged from the ER from our standpoint.  Chrystie Nose, MD, [facility] Attending Cardiologist [facility]

## 2012-04-13 NOTE — ED Notes (Signed)
Pt denies chest pain currently; pt denies shortness of breath and nausea.

## 2012-04-13 NOTE — ED Notes (Signed)
Pt ambulatory leaving ED with wife. Pt denies chest pain. Pt given d/c teaching. Pt has no further questions upon d/c. Pt does not appear to be in any acute distress upon d/c.

## 2012-04-13 NOTE — Discharge Instructions (Signed)
Follow up with your cardiologist, as recommended by Dr. Rennis Golden.     Chest Pain (Nonspecific) It is often hard to give a specific diagnosis for the cause of chest pain. There is always a chance that your pain could be related to something serious, such as a heart attack or a blood clot in the lungs. You need to follow up with your caregiver for further evaluation. CAUSES   Heartburn.  Pneumonia or bronchitis.  Anxiety or stress.  Inflammation around your heart (pericarditis) or lung (pleuritis or pleurisy).  A blood clot in the lung.  A collapsed lung (pneumothorax). It can develop suddenly on its own (spontaneous pneumothorax) or from injury (trauma) to the chest.  Shingles infection (herpes zoster virus). The chest wall is composed of bones, muscles, and cartilage. Any of these can be the source of the pain.  The bones can be bruised by injury.  The muscles or cartilage can be strained by coughing or overwork.  The cartilage can be affected by inflammation and become sore (costochondritis). DIAGNOSIS  Lab tests or other studies, such as X-rays, electrocardiography, stress testing, or cardiac imaging, may be needed to find the cause of your pain.  TREATMENT   Treatment depends on what may be causing your chest pain. Treatment may include:  Acid blockers for heartburn.  Anti-inflammatory medicine.  Pain medicine for inflammatory conditions.  Antibiotics if an infection is present.  You may be advised to change lifestyle habits. This includes stopping smoking and avoiding alcohol, caffeine, and chocolate.  You may be advised to keep your head raised (elevated) when sleeping. This reduces the chance of acid going backward from your stomach into your esophagus.  Most of the time, nonspecific chest pain will improve within 2 to 3 days with rest and mild pain medicine. HOME CARE INSTRUCTIONS   If antibiotics were prescribed, take your antibiotics as directed. Finish them  even if you start to feel better.  For the next few days, avoid physical activities that bring on chest pain. Continue physical activities as directed.  Do not smoke.  Avoid drinking alcohol.  Only take over-the-counter or prescription medicine for pain, discomfort, or fever as directed by your caregiver.  Follow your caregiver's suggestions for further testing if your chest pain does not go away.  Keep any follow-up appointments you made. If you do not go to an appointment, you could develop lasting (chronic) problems with pain. If there is any problem keeping an appointment, you must call to reschedule. SEEK MEDICAL CARE IF:   You think you are having problems from the medicine you are taking. Read your medicine instructions carefully.  Your chest pain does not go away, even after treatment.  You develop a rash with blisters on your chest. SEEK IMMEDIATE MEDICAL CARE IF:   You have increased chest pain or pain that spreads to your arm, neck, jaw, back, or abdomen.  You develop shortness of breath, an increasing cough, or you are coughing up blood.  You have severe back or abdominal pain, feel nauseous, or vomit.  You develop severe weakness, fainting, or chills.  You have a fever. THIS IS AN EMERGENCY. Do not wait to see if the pain will go away. Get medical help at once. Call your local emergency services (911 in [place] Do not drive yourself to the hospital. MAKE SURE YOU:   Understand these instructions.  Will watch your condition.  Will get help right away if you are not doing well or get worse.  Document Released: 02/13/2005 Document Revised: 07/29/2011 Document Reviewed: 12/10/2007 [facility] Patient Information 2013 [place], [place].

## 2012-04-13 NOTE — ED Provider Notes (Signed)
History     CSN: [id number]  Arrival date & time 04/13/12  1244   First MD Initiated Contact with Patient 04/13/12 1335      Chief Complaint  Patient presents with  . Chest Pain    (Consider location/radiation/quality/duration/timing/severity/associated sxs/prior treatment) HPI Comments: Patient is a 71 year old male with a past medical history including CAD and HTN who presents with chest pain that started suddenly yesterday morning. Patient reports waking up and having sudden onset of dull chest pain that was located in the left chest and does not radiate. The patient reports associated nausea. The pain has been constant since yesterday morning and has been unrelieved with nitroglycerin. Patient has a stent placement 5 months ago. He is taking Ticagrelor for anticoagulation. No aggravating/allevaiting factors. Patient denies fever, headache, visual changes, vomiting, diarrhea, abdominal pain, numbness/tingling.    Patient is a 71 y.o. male presenting with chest pain.  Chest Pain Primary symptoms include nausea.     Past Medical History  Diagnosis Date  . Coronary artery disease   . Hypertension   . Anginal pain   . Exertional dyspnea   . Hyperparathyroidism     [facility]"  . Prostate cancer 2010  . Malaria 1969  . Pneumonia ~ 2004    "walking pneumonia"  . Glaucoma     bilaterlly    Past Surgical History  Procedure Date  . Coronary angioplasty with stent placement 10/23/11    "1"  . Vasectomy 1982  . Prostate biopsy ~ 2000; ~2005; 2010  . Mole excision     "multiple"  . Mohs surgery ~ 2002; 2012    right neck; scalp    History reviewed. No pertinent family history.  History  Substance Use Topics  . Smoking status: Never Smoker   . Smokeless tobacco: Never Used  . Alcohol Use: 21.0 oz/week    35 Shots of liquor per week      Review of Systems  Cardiovascular: Positive for chest pain.  Gastrointestinal: Positive for nausea.  All  other systems reviewed and are negative.    Allergies  Review of patient's allergies indicates no known allergies.  Home Medications   Current Outpatient Rx  Name  Route  Sig  Dispense  Refill  . ACETAMINOPHEN 325 MG PO TABS   Oral   Take 650 mg by mouth every 6 (six) hours as needed. For pain         . AMLODIPINE BESYLATE 5 MG PO TABS   Oral   Take 2.5 mg by mouth daily.         . ASPIRIN 81 MG PO CHEW   Oral   Chew 1 tablet (81 mg total) by mouth daily.         . ATORVASTATIN CALCIUM 40 MG PO TABS   Oral   Take 1 tablet (40 mg total) by mouth daily at 6 PM.   30 tablet   11   . VITAMIN D 1000 UNITS PO TABS   Oral   Take 1,000 Units by mouth daily.         Marland Kitchen FINASTERIDE 5 MG PO TABS   Oral   Take 5 mg by mouth daily.         . IBUPROFEN 200 MG PO TABS   Oral   Take 200 mg by mouth every 6 (six) hours as needed. For pain         . ISOSORBIDE MONONITRATE ER 30  MG PO TB24   Oral   Take 30 mg by mouth daily.         Marland Kitchen LATANOPROST 0.005 % OP SOLN   Both Eyes   Place 1 drop into both eyes at bedtime.         Marland Kitchen METOPROLOL TARTRATE 25 MG PO TABS   Oral   Take 12.5 mg by mouth 2 (two) times daily.         Marland Kitchen NITROGLYCERIN 0.4 MG SL SUBL   Sublingual   Place 1 tablet (0.4 mg total) under the tongue every 5 (five) minutes as needed for chest pain.   25 tablet   2   . TAMSULOSIN HCL 0.4 MG PO CAPS   Oral   Take 0.4 mg by mouth daily after supper.          Marland Kitchen TICAGRELOR 90 MG PO TABS   Oral   Take 1 tablet (90 mg total) by mouth 2 (two) times daily.   60 tablet   11     BP 145/71  Pulse 61  Temp 97.8 F (36.6 C) (Oral)  Resp 21  SpO2 97%  Physical Exam  Nursing note and vitals reviewed. Constitutional: He is oriented to person, place, and time. He appears well-developed and well-nourished. No distress.  HENT:  Head: Normocephalic and atraumatic.  Mouth/Throat: Oropharynx is clear and moist. No oropharyngeal exudate.  Eyes:  Conjunctivae normal and EOM are normal. Pupils are equal, round, and reactive to light. No scleral icterus.  Neck: Normal range of motion. Neck supple.  Cardiovascular: Normal rate and regular rhythm.  Exam reveals no gallop and no friction rub.   No murmur heard. Pulmonary/Chest: Effort normal and breath sounds normal. He has no wheezes. He has no rales. He exhibits no tenderness.  Abdominal: Soft. He exhibits no distension. There is no tenderness. There is no rebound.  Musculoskeletal: Normal range of motion.  Neurological: He is alert and oriented to person, place, and time. Coordination normal.       Speech is goal-oriented. Moves limbs without ataxia.   Skin: Skin is warm and dry. He is not diaphoretic.  Psychiatric: He has a normal mood and affect. His behavior is normal.    ED Course  Procedures (including critical care time)  Labs Reviewed  BASIC METABOLIC PANEL - Abnormal; Notable for the following:    Calcium 11.5 (*)     All other components within normal limits  CBC WITH DIFFERENTIAL  PROTIME-INR  POCT I-STAT TROPONIN I   Dg Chest 2 View  04/13/2012  *RADIOLOGY REPORT*  Clinical Data: Chest pain  CHEST - 2 VIEW  Comparison: 10/17/2011  Findings: Lungs are essentially clear. No pleural effusion or pneumothorax.  Stable mild cardiomegaly.  Visualized osseous structures are within normal limits.  IMPRESSION: No evidence of acute cardiopulmonary disease.   Original Report Authenticated By: Charline Bills, M.D.      No diagnosis found.    MDM  3:16 PM Labs and chest xray unremarkable.   3:37 PM [facility] will see the patient.          Emilia Beck, PA-C 04/13/12 1555

## 2012-04-13 NOTE — H&P (Signed)
Patient ID: Wesley Johnston MRN: [id number], DOB/AGE: [birth year]   Admit date: 04/13/2012   Primary Physician: Tally Due, MD Primary Cardiologist: Dr Royann Shivers  HPI: 71 y/o we saw in June for chest pain after an abnormal Myoview. The pt had a diagnostic cath followed by an elective PDA DES. He had no other significant CAD. He has had vague Lt upper chest discomfort off and on since. He had a Myoview in July that was low risk. He came to the ER today after having taken 2 NTG yesterday and again today. His pain is described as an "ache" Lt upper chest. It is not exertional and there is no radiation to his jaw or arms. He did have some mild nausea and lightheadedness today. His symptoms are resolved after NTG in the ER. His EKG is normal and Troponin negative.   Problem List: Past Medical History  Diagnosis Date  . Coronary artery disease   . Hypertension   . Anginal pain   . Exertional dyspnea   . Hyperparathyroidism     [facility]  . Prostate cancer 2010  . Malaria 1969  . Pneumonia ~ 2004    "walking pneumonia"  . Glaucoma     bilaterlly    Past Surgical History  Procedure Date  . Coronary angioplasty with stent placement 10/23/11    "1"  . Vasectomy 1982  . Prostate biopsy ~ 2000; ~2005; 2010  . Mole excision     "multiple"  . Mohs surgery ~ 2002; 2012    right neck; scalp     Allergies: No Known Allergies   Home Medications See Med Rec   History reviewed. No pertinent family history.   History   Social History  . Marital Status: Married    Spouse Name: N/A    Number of Children: N/A  . Years of Education: N/A   Occupational History  . Not on file.   Social History Main Topics  . Smoking status: Never Smoker   . Smokeless tobacco: Never Used  . Alcohol Use: 21.0 oz/week    35 Shots of liquor per week  . Drug Use: Yes    Special: Marijuana     Comment: 10/23/11 "quit marijuana more than 35 years ago"  . Sexually Active: Not  Currently   Other Topics Concern  . Not on file   Social History Narrative  . No narrative on file     Review of Systems: General: negative for chills, fever, night sweats or weight changes.  Cardiovascular: negative for chest pain, dyspnea on exertion, edema, orthopnea, palpitations, paroxysmal nocturnal dyspnea or shortness of breath Dermatological: negative for rash Respiratory: negative for cough or wheezing Urologic: negative for hematuria Abdominal: negative for nausea, vomiting, diarrhea, bright red blood per rectum, melena, or hematemesis Neurologic: negative for visual changes, syncope, or dizziness All other systems reviewed and are otherwise negative except as noted above.  Physical Exam: Blood pressure 145/71, pulse 61, temperature 97.8 F (36.6 C), temperature source Oral, resp. rate 21, SpO2 97.00%.  General appearance: alert, cooperative and no distress Neck: no carotid bruit and no JVD Lungs: clear to auscultation bilaterally Heart: regular rate and rhythm Abdomen: soft, non-tender; bowel sounds normal; no masses,  no organomegaly Extremities: extremities normal, atraumatic, no cyanosis or edema Pulses: 2+ and symmetric Skin: Skin color, texture, turgor normal. No rashes or lesions Neurologic: Grossly normal    Labs:   Results for orders placed during the hospital encounter of 04/13/12 (  from the past 24 hour(s))  CBC WITH DIFFERENTIAL     Status: Normal   Collection Time   04/13/12 12:49 PM      Component Value Range   WBC 5.7  4.0 - 10.5 K/uL   RBC 4.78  4.22 - 5.81 MIL/uL   Hemoglobin 14.3  13.0 - 17.0 g/dL   HCT 24.4  01.0 - 27.2 %   MCV 86.4  78.0 - 100.0 fL   MCH 29.9  26.0 - 34.0 pg   MCHC 34.6  30.0 - 36.0 g/dL   RDW 53.6  64.4 - 03.4 %   Platelets 219  150 - 400 K/uL   Neutrophils Relative 56  43 - 77 %   Neutro Abs 3.2  1.7 - 7.7 K/uL   Lymphocytes Relative 32  12 - 46 %   Lymphs Abs 1.8  0.7 - 4.0 K/uL   Monocytes Relative 9  3 - 12 %    Monocytes Absolute 0.5  0.1 - 1.0 K/uL   Eosinophils Relative 3  0 - 5 %   Eosinophils Absolute 0.2  0.0 - 0.7 K/uL   Basophils Relative 1  0 - 1 %   Basophils Absolute 0.0  0.0 - 0.1 K/uL  BASIC METABOLIC PANEL     Status: Abnormal   Collection Time   04/13/12 12:49 PM      Component Value Range   Sodium 138  135 - 145 mEq/L   Potassium 3.8  3.5 - 5.1 mEq/L   Chloride 103  96 - 112 mEq/L   CO2 25  19 - 32 mEq/L   Glucose, Bld 98  70 - 99 mg/dL   BUN 13  6 - 23 mg/dL   Creatinine, Ser 7.42  0.50 - 1.35 mg/dL   Calcium 59.5 (*) 8.4 - 10.5 mg/dL   GFR calc non Af Amer >90  >90 mL/min   GFR calc Af Amer >90  >90 mL/min  PROTIME-INR     Status: Normal   Collection Time   04/13/12 12:49 PM      Component Value Range   Prothrombin Time 12.6  11.6 - 15.2 seconds   INR 0.95  0.00 - 1.49  POCT I-STAT TROPONIN I     Status: Normal   Collection Time   04/13/12  1:18 PM      Component Value Range   Troponin i, poc 0.00  0.00 - 0.08 ng/mL   Comment 3              Radiology/Studies: Dg Chest 2 View  04/13/2012  *RADIOLOGY REPORT*  Clinical Data: Chest pain  CHEST - 2 VIEW  Comparison: 10/17/2011  Findings: Lungs are essentially clear. No pleural effusion or pneumothorax.  Stable mild cardiomegaly.  Visualized osseous structures are within normal limits.  IMPRESSION: No evidence of acute cardiopulmonary disease.   Original Report Authenticated By: Charline Bills, M.D.     EKG:NSR LAD  ASSESSMENT AND PLAN:  Principal Problem:  *Unstable angina Active Problems:  CAD- PDA DES 10/23/11. No other significant CAD. Myoview for chest pain July 2013 low risk  HTN (hypertension)  Dyslipidemia, LDL 170 Oct 2012  BPH (benign prostatic hyperplasia)  Plan- MD to see.  Deland Pretty, PA-C 04/13/2012, 3:51 PM

## 2012-04-13 NOTE — ED Provider Notes (Signed)
I saw the patient. Briefly, to facilitate discharge. He had been seen by Dr. Rennis Golden, and his PA. I talked to Mr. Wesley Johnston. He reportedly wants  The pt.  on a PPI. And they plan on taking Prilosec.    Flint Melter, MD 04/13/12 [phone]

## 2012-04-13 NOTE — ED Notes (Signed)
Wesley Johnston, [place] notified about pts pain and request for pain medication

## 2012-04-13 NOTE — ED Notes (Addendum)
Wesley Johnston, [place] notified about pts concern for taking Aspirin since took some this morning and was told not to take aspirin while taking Brilinta.

## 2012-04-13 NOTE — ED Notes (Signed)
Magdalene River, PA at bedside.

## 2012-04-13 NOTE — ED Notes (Signed)
[facility] at bedside.

## 2012-04-13 NOTE — ED Notes (Signed)
Pt undressed, in gown, on monitor, continuous pulse oximetry and blood pressure cuff; family at bedside 

## 2012-04-13 NOTE — ED Notes (Signed)
Pt denies chest pain, shortness of breath, nausea and dizziness.

## 2012-04-15 NOTE — ED Provider Notes (Signed)
Medical screening examination/treatment/procedure(s) were performed by non-physician practitioner and as supervising physician I was immediately available for consultation/collaboration.   Laray Anger, DO 04/15/12 [phone]

## 2012-05-11 DIAGNOSIS — I2510 Atherosclerotic heart disease of native coronary artery without angina pectoris: Secondary | ICD-10-CM | POA: Diagnosis not present

## 2012-06-18 DIAGNOSIS — I10 Essential (primary) hypertension: Secondary | ICD-10-CM | POA: Diagnosis not present

## 2012-09-15 DIAGNOSIS — I2510 Atherosclerotic heart disease of native coronary artery without angina pectoris: Secondary | ICD-10-CM | POA: Diagnosis not present

## 2012-09-15 DIAGNOSIS — I498 Other specified cardiac arrhythmias: Secondary | ICD-10-CM | POA: Diagnosis not present

## 2012-09-15 DIAGNOSIS — R42 Dizziness and giddiness: Secondary | ICD-10-CM | POA: Diagnosis not present

## 2012-10-13 ENCOUNTER — Ambulatory Visit (INDEPENDENT_AMBULATORY_CARE_PROVIDER_SITE_OTHER): Payer: Medicare Other | Admitting: Internal Medicine

## 2012-10-13 VITALS — BP 118/64 | HR 87 | Temp 97.7°F | Resp 16 | Ht 72.0 in | Wt 201.0 lb

## 2012-10-13 DIAGNOSIS — L237 Allergic contact dermatitis due to plants, except food: Secondary | ICD-10-CM

## 2012-10-13 DIAGNOSIS — L255 Unspecified contact dermatitis due to plants, except food: Secondary | ICD-10-CM | POA: Diagnosis not present

## 2012-10-13 MED ORDER — METHYLPREDNISOLONE ACETATE 80 MG/ML IJ SUSP
120.0000 mg | Freq: Once | INTRAMUSCULAR | Status: AC
  Administered 2012-10-13: 120 mg via INTRAMUSCULAR

## 2012-10-13 MED ORDER — PREDNISONE 20 MG PO TABS: ORAL_TABLET | ORAL | Status: DC

## 2012-10-13 NOTE — Progress Notes (Signed)
  Subjective:    Patient ID: Wesley Johnston, male    DOB: 10/28/1940, 72 y.o.   MRN: [id number]  HPI  72 year old male presents with itchy rash on left inner arm, rash under chin and on chin x yesterday Doing yard work over the weekend and believes exposed to poison ivy   Review of Systems     Objective:   Physical Exam        Assessment & Plan:   Poison ivy Depo medrol 120 mg IM in office

## 2012-10-13 NOTE — Progress Notes (Signed)
  Subjective:    Patient ID: Wesley Johnston, male    DOB: June 20, 1940, 72 y.o.   MRN: [id number]  HPI Worked in yard, now breaking out with itchy rash. No fever, no pain or purulence.   Review of Systems     Objective:   Physical Exam Classic PI plaques and vesicles       Assessment & Plan:  Poison ivy Depomedrol 120mg  IM

## 2012-10-22 ENCOUNTER — Ambulatory Visit (INDEPENDENT_AMBULATORY_CARE_PROVIDER_SITE_OTHER): Payer: Medicare Other | Admitting: Cardiovascular Disease

## 2012-10-22 VITALS — BP 114/70 | HR 54 | Resp 12 | Ht 73.0 in | Wt 207.0 lb

## 2012-10-22 DIAGNOSIS — I10 Essential (primary) hypertension: Secondary | ICD-10-CM | POA: Diagnosis not present

## 2012-10-22 DIAGNOSIS — E785 Hyperlipidemia, unspecified: Secondary | ICD-10-CM | POA: Diagnosis not present

## 2012-10-22 DIAGNOSIS — I2510 Atherosclerotic heart disease of native coronary artery without angina pectoris: Secondary | ICD-10-CM | POA: Diagnosis not present

## 2012-10-22 DIAGNOSIS — I498 Other specified cardiac arrhythmias: Secondary | ICD-10-CM | POA: Diagnosis not present

## 2012-10-22 DIAGNOSIS — R001 Bradycardia, unspecified: Secondary | ICD-10-CM

## 2012-10-22 NOTE — Progress Notes (Signed)
Patient ID: Wesley Johnston, male   DOB: 1940/05/21, 72 y.o.   MRN: [id number]      Reason for office visit Coronary artery disease  Wesley Johnston presented with unstable angina roughly one year ago. His stress test was abnormal. Cardiac catheterization showed a high-grade stenosis in the posterior descending artery branch of the right coronary artery and this was treated with placement of a drug-eluting stent. He has had no new events since that time. Some atypical chest pain that occurred 6 months ago was evaluated with a DEXA scan Myoview. No perfusion abnormalities were seen. He feels well and is physically active just at the log of his blood pressure and this has been well-controlled. Labs were performed at the [facility] a few months ago.    No Known Allergies  Current Outpatient Prescriptions  Medication Sig Dispense Refill  . acetaminophen (TYLENOL) 325 MG tablet Take 650 mg by mouth every 6 (six) hours as needed. For pain      . amLODipine (NORVASC) 5 MG tablet Take 2.5 mg by mouth daily.      Marland Kitchen atorvastatin (LIPITOR) 40 MG tablet Take 1 tablet (40 mg total) by mouth daily at 6 PM.  30 tablet  11  . cholecalciferol (VITAMIN D) 1000 UNITS tablet Take 1,000 Units by mouth daily.      . finasteride (PROSCAR) 5 MG tablet Take 5 mg by mouth daily.      Marland Kitchen latanoprost (XALATAN) 0.005 % ophthalmic solution Place 1 drop into both eyes at bedtime.      . nitroGLYCERIN (NITROSTAT) 0.4 MG SL tablet Place 1 tablet (0.4 mg total) under the tongue every 5 (five) minutes as needed for chest pain.  25 tablet  2  . predniSONE (DELTASONE) 20 MG tablet 1 po tid for 4d, bid for 4d, qd for 4d pc for PI  24 tablet  0  . Tamsulosin HCl (FLOMAX) 0.4 MG CAPS Take 0.4 mg by mouth daily after supper.       Marland Kitchen ibuprofen (ADVIL,MOTRIN) 200 MG tablet Take 200 mg by mouth every 6 (six) hours as needed. For pain       No current facility-administered medications for this visit.    Past Medical History  Diagnosis Date    . Coronary artery disease   . Hypertension   . Anginal pain   . Exertional dyspnea   . Hyperparathyroidism     [facility]"  . Prostate cancer 2010  . Malaria 1969  . Pneumonia ~ 2004    "walking pneumonia"  . Glaucoma     bilaterlly    Past Surgical History  Procedure Laterality Date  . Coronary angioplasty with stent placement  10/23/11    "1"  . Vasectomy  1982  . Prostate biopsy  ~ 2000; ~2005; 2010  . Mole excision      "multiple"  . Mohs surgery  ~ 2002; 2012    right neck; scalp    Family History  Problem Relation Age of Onset  . Alzheimer's disease Mother   . Heart attack Father     History   Social History  . Marital Status: Married    Spouse Name: N/A    Number of Children: N/A  . Years of Education: N/A   Occupational History  . Not on file.   Social History Main Topics  . Smoking status: Never Smoker   . Smokeless tobacco: Never Used  . Alcohol Use: 21.0 oz/week  35 Shots of liquor per week  . Drug Use: Yes    Special: Marijuana     Comment: 10/23/11 "quit marijuana more than 35 years ago"  . Sexually Active: Not Currently   Other Topics Concern  . Not on file   Social History Narrative  . No narrative on file    Review of systems: The patient specifically denies any chest pain at rest or with exertion, dyspnea at rest or with exertion, orthopnea, paroxysmal nocturnal dyspnea, syncope, palpitations, focal neurological deficits, intermittent claudication, lower extremity edema, unexplained weight gain, cough, hemoptysis or wheezing.  The patient also denies abdominal pain, nausea, vomiting, dysphagia, diarrhea, constipation, polyuria, polydipsia, dysuria, hematuria, frequency, urgency, abnormal bleeding or bruising, fever, chills, unexpected weight changes, mood swings, change in skin or hair texture, change in voice quality, auditory or visual problems, allergic reactions or rashes, new musculoskeletal complaints other than  usual "aches and pains".   PHYSICAL EXAM BP 114/70  Pulse 54  Resp 12  Ht 6\' 1"  (1.854 m)  Wt 93.895 kg (207 lb)  BMI 27.32 kg/m2  General: Alert, oriented x3, no distress Head: no evidence of trauma, PERRL, EOMI, no exophtalmos or lid lag, no myxedema, no xanthelasma; normal ears, nose and oropharynx Neck: normal jugular venous pulsations and no hepatojugular reflux; brisk carotid pulses without delay and no carotid bruits Chest: clear to auscultation, no signs of consolidation by percussion or palpation, normal fremitus, symmetrical and full respiratory excursions Cardiovascular: normal position and quality of the apical impulse, regular rhythm, normal first and second heart sounds, no murmurs, rubs or gallops Abdomen: no tenderness or distention, no masses by palpation, no abnormal pulsatility or arterial bruits, normal bowel sounds, no hepatosplenomegaly Extremities: no clubbing, cyanosis or edema; 2+ radial, ulnar and brachial pulses bilaterally; 2+ right femoral, posterior tibial and dorsalis pedis pulses; 2+ left femoral, posterior tibial and dorsalis pedis pulses; no subclavian or femoral bruits Neurological: grossly nonfocal   EKG: Normal sinus rhythm/mild sinus bradycardia biphasic T waves in leads V3 V4  Lipid Panel  No results found for this basename: chol, trig, hdl, cholhdl, vldl, ldlcalc    BMET    Component Value Date/Time   NA 138 04/13/2012 1249   K 3.8 04/13/2012 1249   CL 103 04/13/2012 1249   CO2 25 04/13/2012 1249   GLUCOSE 98 04/13/2012 1249   BUN 13 04/13/2012 1249   CREATININE 0.65 04/13/2012 1249   CALCIUM 11.5* 04/13/2012 1249   GFRNONAA >90 04/13/2012 1249   GFRAA >90 04/13/2012 1249     ASSESSMENT AND PLAN CAD- PDA DES 10/23/11. No other significant CAD. Myoview for chest pain July 2013 low risk It has been exactly one year since Mr. Wesley Johnston underwent placement of a drug-eluting stent to the PDA branch of the right coronary artery. He had some  atypical chest pain about 6 months ago and a nuclear stress test performed at that time showed low risk findings. He is physically very active and asymptomatic. He will stop Brillinta, and continue low-dose aspirin. He is intolerant to beta blockers (symptomatic bradycardia). He is on appropriate statin therapy labs performed at the [facility] this year showed good lipid control. He no longer needs to take long-acting nitrates as he is asymptomatic.  Dyslipidemia, LDL 170 Oct 2012 Labs performed in January at the [facility] showed a total cholesterol of 133, HDL 64, LDL 58, triglycerides 53. No changes are made to his lipid-lowering regime. Liver function tests were normal at that time and  the calcium was borderline high at 10.7.   HTN (hypertension) Well-controlled  Orders Placed This Encounter  Procedures  . EKG 12-Lead   No orders of the defined types were placed in this encounter.    Junious Silk, MD, [facility] [phone] office [phone] pager

## 2012-10-22 NOTE — Patient Instructions (Signed)
Stop taking the following medications: Metoprolol tartrate, isosorbide mononitrate, Brilinta. Keep up the good work with exercise and diet. Followup in 1 year.

## 2012-10-25 ENCOUNTER — Encounter: Payer: Self-pay | Admitting: Cardiovascular Disease

## 2012-10-25 NOTE — Assessment & Plan Note (Signed)
Well controlled 

## 2012-10-25 NOTE — Assessment & Plan Note (Addendum)
It has been exactly one year since Mr. Wesley Johnston underwent placement of a drug-eluting stent to the PDA branch of the right coronary artery. He had some atypical chest pain about 6 months ago and a nuclear stress test performed at that time showed low risk findings. He is physically very active and asymptomatic. He will stop Brillinta, and continue low-dose aspirin. He is intolerant to beta blockers (symptomatic bradycardia). He is on appropriate statin therapy labs performed at the [facility] this year showed good lipid control. He no longer needs to take long-acting nitrates as he is asymptomatic.

## 2012-10-25 NOTE — Assessment & Plan Note (Signed)
Labs performed in January at the [facility] showed a total cholesterol of 133, HDL 64, LDL 58, triglycerides 53. No changes are made to his lipid-lowering regime. Liver function tests were normal at that time and the calcium was borderline high at 10.7.

## 2012-10-29 ENCOUNTER — Telehealth: Payer: Self-pay | Admitting: *Deleted

## 2012-10-29 NOTE — Telephone Encounter (Signed)
Results of event monitor called to patient.  Discussed at last office visit.

## 2012-11-06 IMAGING — CR DG CHEST 2V
2 series · 2 of 2 positions shown · non-contrast
Comparison: None.

CLINICAL DATA: Preop for cardiac catheterization, some shortness of
breath and mid chest discomfort

CHEST - 2 VIEW

[view not recorded (1 of 2)]
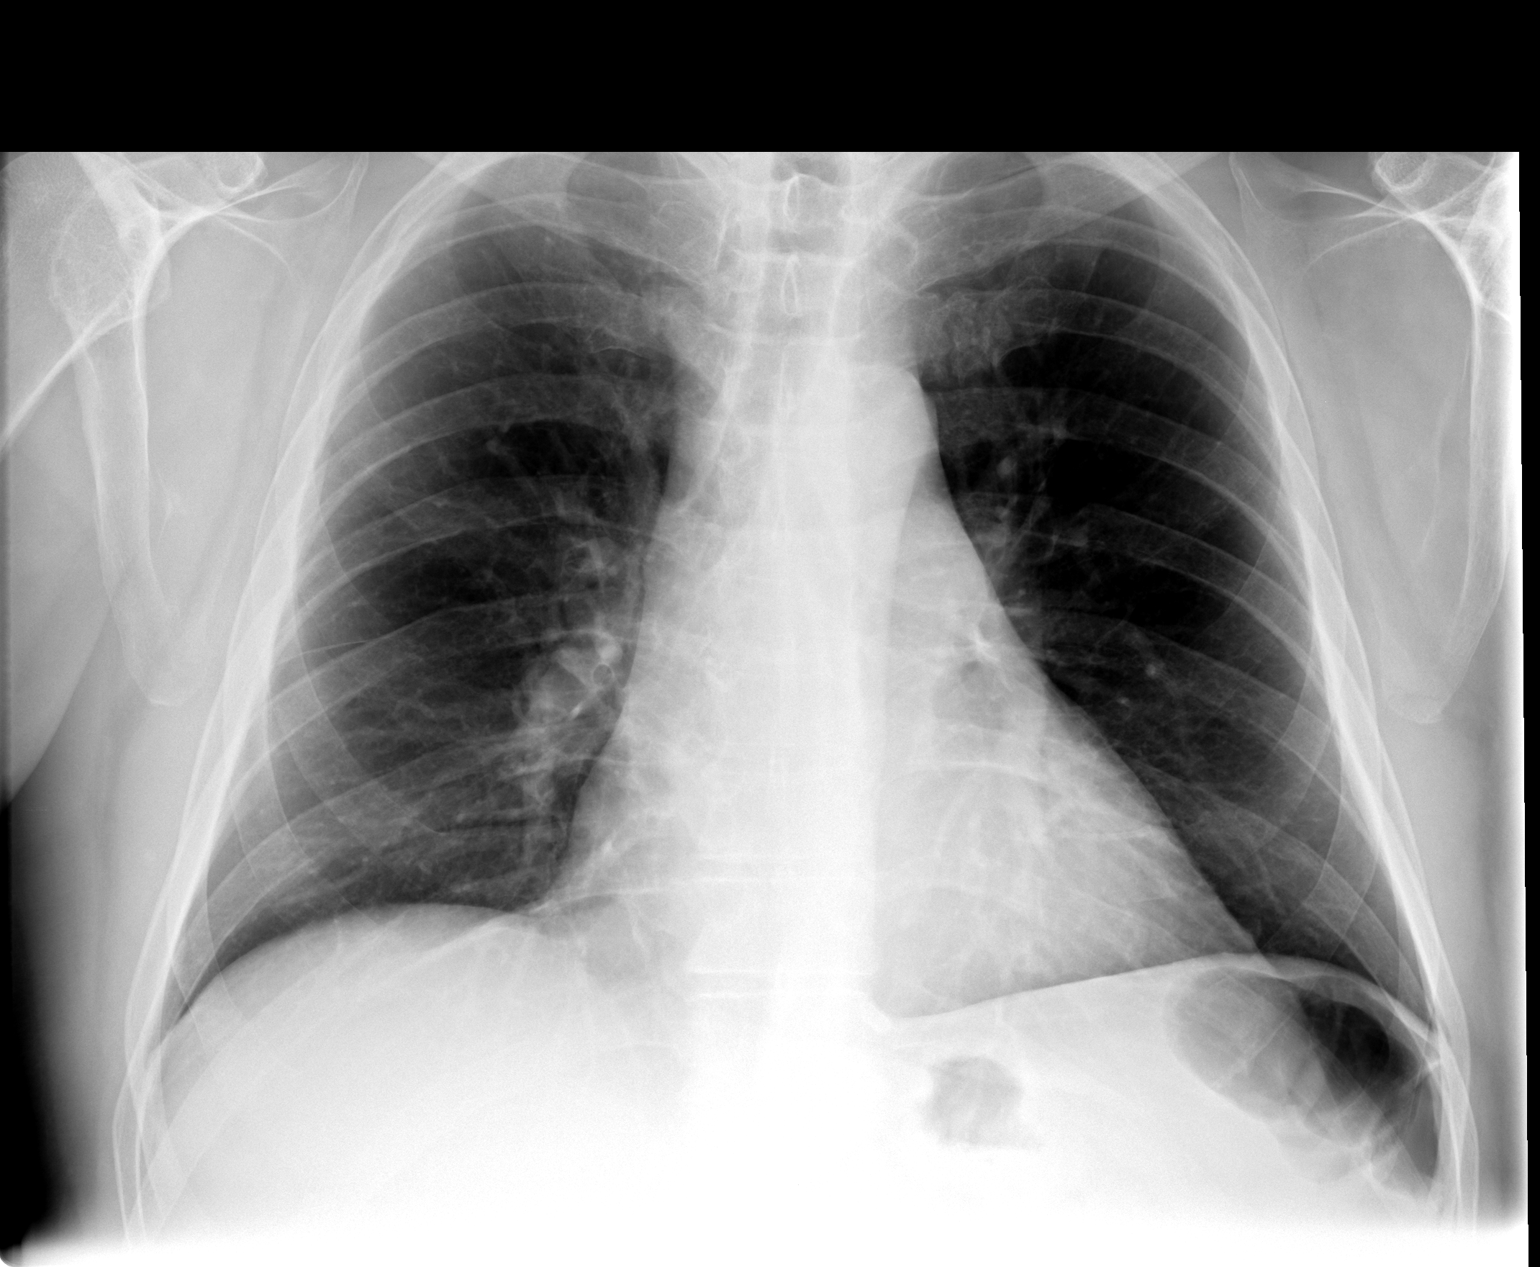

[view not recorded (2 of 2)]
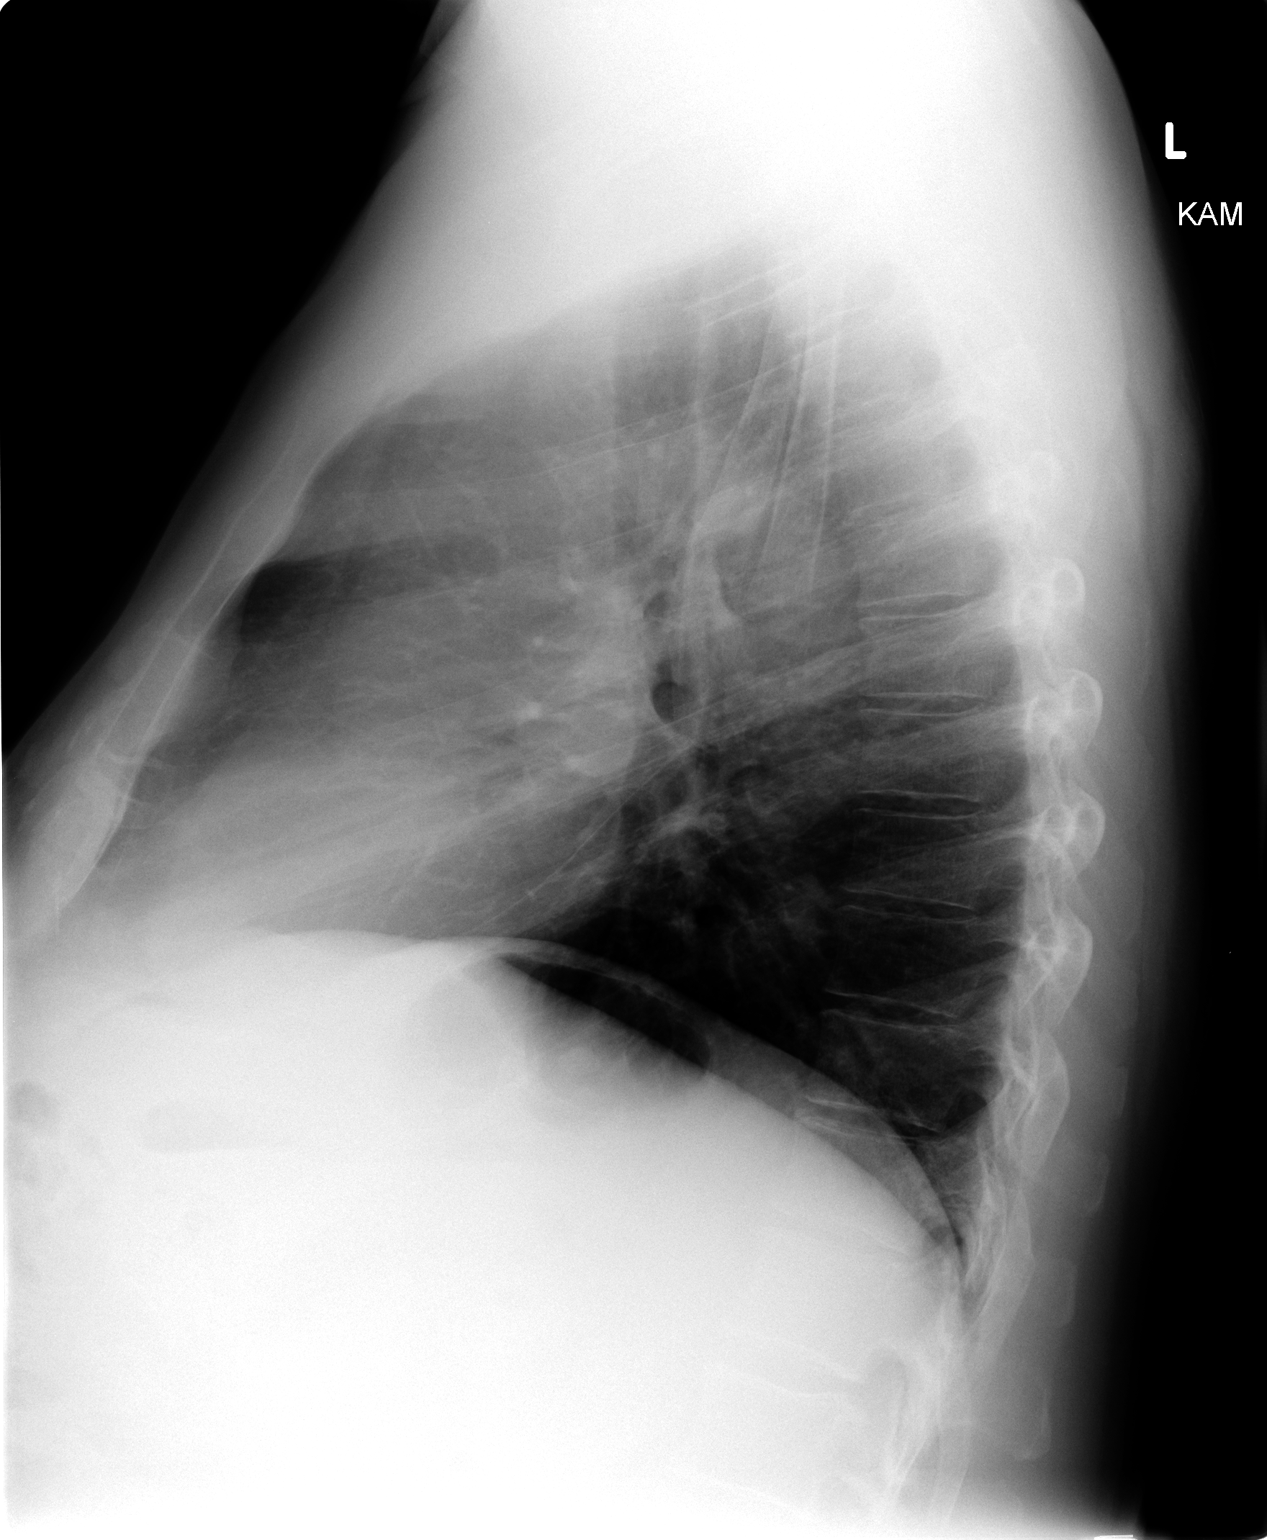

[2 of 2 positions shown; findings below may reference images not displayed]

FINDINGS: The lungs are clear.  Mediastinal contours appear normal.
The heart is mildly enlarged.  No bony abnormality is seen.
IMPRESSION: Mild cardiomegaly.  No active lung disease.

## 2012-12-29 ENCOUNTER — Ambulatory Visit (INDEPENDENT_AMBULATORY_CARE_PROVIDER_SITE_OTHER): Payer: Medicare Other | Admitting: Family Medicine

## 2012-12-29 ENCOUNTER — Ambulatory Visit: Payer: Medicare Other

## 2012-12-29 VITALS — BP 144/76 | HR 59 | Temp 97.5°F | Resp 18 | Ht 71.5 in | Wt 207.2 lb

## 2012-12-29 DIAGNOSIS — M549 Dorsalgia, unspecified: Secondary | ICD-10-CM

## 2012-12-29 MED ORDER — DIAZEPAM 2 MG PO TABS: 2.0000 mg | ORAL_TABLET | Freq: Every evening | ORAL | Status: DC | PRN

## 2012-12-29 NOTE — Progress Notes (Signed)
  Subjective:    Patient ID: Wesley Johnston, male    DOB: 03-21-1941, 72 y.o.   MRN: [id number]  HPI 72 yo male with complaint of low back pain.  Onset over 10 years ago.  Pain in left low back, noted most prominently in gluteal region.  No radiation.  No injury.  Pain worse in am when he is getting up.  Improves during the day.  No weakness or numbness noted.  States it transiently improved after taking prednisone dose pack for poison ivy.  Past Medical History  Diagnosis Date  . Coronary artery disease   . Hypertension   . Anginal pain   . Exertional dyspnea   . Hyperparathyroidism     [facility]"  . Prostate cancer 2010  . Malaria 1969  . Pneumonia ~ 2004    "walking pneumonia"  . Glaucoma     bilaterlly   Past Surgical History  Procedure Laterality Date  . Coronary angioplasty with stent placement  10/23/11    "1"  . Vasectomy  1982  . Prostate biopsy  ~ 2000; ~2005; 2010  . Mole excision      "multiple"  . Mohs surgery  ~ 2002; 2012    right neck; scalp   No Known Allergies    Review of Systems  Constitutional: Negative for fever, chills and activity change.  HENT: Negative for neck pain.   Respiratory: Negative for cough, chest tightness and shortness of breath.   Cardiovascular: Negative for chest pain and leg swelling.  Gastrointestinal: Negative for nausea, vomiting, diarrhea and constipation.  Genitourinary: Negative for urgency, flank pain and decreased urine volume.  Musculoskeletal: Positive for back pain. Negative for myalgias, joint swelling, arthralgias and gait problem.  Neurological: Negative for weakness and numbness.       Objective:   Physical Exam  Blood pressure 144/76, pulse 59, temperature 97.5 F (36.4 C), temperature source Oral, resp. rate 18, height 5' 11.5" (1.816 m), weight 207 lb 3.2 oz (93.985 kg), SpO2 99.00%. Body mass index is 28.5 kg/(m^2). Well-developed, well nourished male who is awake, alert and oriented, in  NAD. HEENT: [place]/AT, PERRL, EOMI.  Sclera and conjunctiva are clear OP is clear. Neck: supple, non-tender, no lymphadenopathy, thyromegaly. Heart: RRR, no murmur Lungs: normal effort, CTA Abdomen: normo-active bowel sounds, supple, non-tender, no mass or organomegaly. Extremities: no cyanosis, clubbing or edema. Skin: warm and dry without rash. Psychologic: good mood and appropriate affect, normal speech and behavior. Back:  Left lower lumbar/gluteal tenderness.  Negative straight leg raise.  No vertebral point tenderness.   Left Hip:  Normal log roll, no tenderness over greater troch, normal flexion and extension.  LS Xrays: Negative for severe DJD or alignment abnormalities.     Assessment & Plan:  Musckuloskeletal low back  Pain.  Will start on PO valium prn at night.

## 2012-12-29 NOTE — Patient Instructions (Signed)
Back Pain, Adult  Low back pain is very common. About 1 in 5 people have back pain. The cause of low back pain is rarely dangerous. The pain often gets better over time. About half of people with a sudden onset of back pain feel better in just 2 weeks. About 8 in 10 people feel better by 6 weeks.   CAUSES  Some common causes of back pain include:  · Strain of the muscles or ligaments supporting the spine.  · Wear and tear (degeneration) of the spinal discs.  · Arthritis.  · Direct injury to the back.  DIAGNOSIS  Most of the time, the direct cause of low back pain is not known. However, back pain can be treated effectively even when the exact cause of the pain is unknown. Answering your caregiver's questions about your overall health and symptoms is one of the most accurate ways to make sure the cause of your pain is not dangerous. If your caregiver needs more information, he or she may order lab work or imaging tests (X-rays or MRIs). However, even if imaging tests show changes in your back, this usually does not require surgery.  HOME CARE INSTRUCTIONS  For many people, back pain returns. Since low back pain is rarely dangerous, it is often a condition that people can learn to manage on their own.   · Remain active. It is stressful on the back to sit or stand in one place. Do not sit, drive, or stand in one place for more than 30 minutes at a time. Take short walks on level surfaces as soon as pain allows. Try to increase the length of time you walk each day.  · Do not stay in bed. Resting more than 1 or 2 days can delay your recovery.  · Do not avoid exercise or work. Your body is made to move. It is not dangerous to be active, even though your back may hurt. Your back will likely heal faster if you return to being active before your pain is gone.  · Pay attention to your body when you  bend and lift. Many people have less discomfort when lifting if they bend their knees, keep the load close to their bodies, and  avoid twisting. Often, the most comfortable positions are those that put less stress on your recovering back.  · Find a comfortable position to sleep. Use a firm mattress and lie on your side with your knees slightly bent. If you lie on your back, put a pillow under your knees.  · Only take over-the-counter or prescription medicines as directed by your caregiver. Over-the-counter medicines to reduce pain and inflammation are often the most helpful. Your caregiver may prescribe muscle relaxant drugs. These medicines help dull your pain so you can more quickly return to your normal activities and healthy exercise.  · Put ice on the injured area.  · Put ice in a plastic bag.  · Place a towel between your skin and the bag.  · Leave the ice on for 15-20 minutes, 3-4 times a day for the first 2 to 3 days. After that, ice and heat may be alternated to reduce pain and spasms.  · Ask your caregiver about trying back exercises and gentle massage. This may be of some benefit.  · Avoid feeling anxious or stressed. Stress increases muscle tension and can worsen back pain. It is important to recognize when you are anxious or stressed and learn ways to manage it. Exercise is a great option.  SEEK MEDICAL CARE IF:  · You have pain that is not relieved with rest or   medicine.  · You have pain that does not improve in 1 week.  · You have new symptoms.  · You are generally not feeling well.  SEEK IMMEDIATE MEDICAL CARE IF:   · You have pain that radiates from your back into your legs.  · You develop new bowel or bladder control problems.  · You have unusual weakness or numbness in your arms or legs.  · You develop nausea or vomiting.  · You develop abdominal pain.  · You feel faint.  Document Released: 05/06/2005 Document Revised: 11/05/2011 Document Reviewed: 09/24/2010  Exit[employer]® Patient Information ©2014 [employer].

## 2012-12-29 NOTE — Progress Notes (Signed)
History and physical exam reviewed with Dr. Lorri Frederick.  LS spine films reviewed.  Agree with assessment and plan.

## 2013-01-28 DIAGNOSIS — M461 Sacroiliitis, not elsewhere classified: Secondary | ICD-10-CM | POA: Diagnosis not present

## 2013-01-28 DIAGNOSIS — M999 Biomechanical lesion, unspecified: Secondary | ICD-10-CM | POA: Diagnosis not present

## 2013-01-28 DIAGNOSIS — M5137 Other intervertebral disc degeneration, lumbosacral region: Secondary | ICD-10-CM | POA: Diagnosis not present

## 2013-02-01 DIAGNOSIS — M461 Sacroiliitis, not elsewhere classified: Secondary | ICD-10-CM | POA: Diagnosis not present

## 2013-02-01 DIAGNOSIS — M5137 Other intervertebral disc degeneration, lumbosacral region: Secondary | ICD-10-CM | POA: Diagnosis not present

## 2013-02-01 DIAGNOSIS — M999 Biomechanical lesion, unspecified: Secondary | ICD-10-CM | POA: Diagnosis not present

## 2013-02-03 DIAGNOSIS — M461 Sacroiliitis, not elsewhere classified: Secondary | ICD-10-CM | POA: Diagnosis not present

## 2013-02-03 DIAGNOSIS — M999 Biomechanical lesion, unspecified: Secondary | ICD-10-CM | POA: Diagnosis not present

## 2013-02-03 DIAGNOSIS — M5137 Other intervertebral disc degeneration, lumbosacral region: Secondary | ICD-10-CM | POA: Diagnosis not present

## 2013-02-08 ENCOUNTER — Telehealth: Payer: Self-pay

## 2013-02-08 DIAGNOSIS — M999 Biomechanical lesion, unspecified: Secondary | ICD-10-CM | POA: Diagnosis not present

## 2013-02-08 DIAGNOSIS — M461 Sacroiliitis, not elsewhere classified: Secondary | ICD-10-CM | POA: Diagnosis not present

## 2013-02-08 DIAGNOSIS — M549 Dorsalgia, unspecified: Secondary | ICD-10-CM

## 2013-02-08 DIAGNOSIS — M5137 Other intervertebral disc degeneration, lumbosacral region: Secondary | ICD-10-CM | POA: Diagnosis not present

## 2013-02-08 MED ORDER — DIAZEPAM 2 MG PO TABS: 2.0000 mg | ORAL_TABLET | Freq: Every evening | ORAL | Status: DC | PRN

## 2013-02-08 NOTE — Telephone Encounter (Signed)
OK for refill x 1; please call into pharmacy.  Call patient --- how is back pain since seeing Dr. Lorri Frederick?

## 2013-02-08 NOTE — Telephone Encounter (Signed)
Pharm requests Rf of diazepam 2 mg tab. Dr Katrinka Blazing this is a pt who saw Dr Lorri Frederick and you signed OV.

## 2013-02-09 NOTE — Telephone Encounter (Signed)
Notified pt that RF was sent and pt stated that he has been to the chiropractor a couple of times and is starting to improve. Dr Katrinka Blazing, [place].

## 2013-02-11 DIAGNOSIS — M461 Sacroiliitis, not elsewhere classified: Secondary | ICD-10-CM | POA: Diagnosis not present

## 2013-02-11 DIAGNOSIS — M5137 Other intervertebral disc degeneration, lumbosacral region: Secondary | ICD-10-CM | POA: Diagnosis not present

## 2013-02-11 DIAGNOSIS — M999 Biomechanical lesion, unspecified: Secondary | ICD-10-CM | POA: Diagnosis not present

## 2013-02-15 DIAGNOSIS — M461 Sacroiliitis, not elsewhere classified: Secondary | ICD-10-CM | POA: Diagnosis not present

## 2013-02-15 DIAGNOSIS — M999 Biomechanical lesion, unspecified: Secondary | ICD-10-CM | POA: Diagnosis not present

## 2013-02-15 DIAGNOSIS — M5137 Other intervertebral disc degeneration, lumbosacral region: Secondary | ICD-10-CM | POA: Diagnosis not present

## 2013-02-18 DIAGNOSIS — M461 Sacroiliitis, not elsewhere classified: Secondary | ICD-10-CM | POA: Diagnosis not present

## 2013-02-18 DIAGNOSIS — M999 Biomechanical lesion, unspecified: Secondary | ICD-10-CM | POA: Diagnosis not present

## 2013-02-18 DIAGNOSIS — M5137 Other intervertebral disc degeneration, lumbosacral region: Secondary | ICD-10-CM | POA: Diagnosis not present

## 2013-02-22 DIAGNOSIS — M461 Sacroiliitis, not elsewhere classified: Secondary | ICD-10-CM | POA: Diagnosis not present

## 2013-02-22 DIAGNOSIS — M999 Biomechanical lesion, unspecified: Secondary | ICD-10-CM | POA: Diagnosis not present

## 2013-02-22 DIAGNOSIS — M5137 Other intervertebral disc degeneration, lumbosacral region: Secondary | ICD-10-CM | POA: Diagnosis not present

## 2013-02-23 DIAGNOSIS — M5137 Other intervertebral disc degeneration, lumbosacral region: Secondary | ICD-10-CM | POA: Diagnosis not present

## 2013-02-23 DIAGNOSIS — M999 Biomechanical lesion, unspecified: Secondary | ICD-10-CM | POA: Diagnosis not present

## 2013-02-23 DIAGNOSIS — M461 Sacroiliitis, not elsewhere classified: Secondary | ICD-10-CM | POA: Diagnosis not present

## 2013-04-19 ENCOUNTER — Telehealth: Payer: Self-pay | Admitting: Cardiovascular Disease

## 2013-04-19 DIAGNOSIS — I714 Abdominal aortic aneurysm, without rupture: Secondary | ICD-10-CM

## 2013-04-19 NOTE — Telephone Encounter (Signed)
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

## 2013-04-19 NOTE — Telephone Encounter (Signed)
Please call-Second call-having problems-he have developed a new problem.It is important  That he ior she talk to somebody asap.

## 2013-04-19 NOTE — Telephone Encounter (Signed)
I spoke with the patient's wife.  She said that he has been having some coccyx pain.  He went to the [place] and then did an xray that showed some DJD changes and a possible AAA of 3.8cm.  I explained that I would let Dr C know.  She seemed upset; I tried to reassure her that at 3.8 we were not overly concerned but that it is something we would investigate and follow up on.  I will defer this to Dr C and call patient's wife back with his recommendation.

## 2013-04-19 NOTE — Telephone Encounter (Signed)
At 3.8 cm it is very small and may never be a clinically important problem. Needs another evaluation (Duplex US) in 12 months

## 2013-04-20 NOTE — Telephone Encounter (Signed)
Pt's wife aware of Dr Renaye Rakers comments and order placed for doppler in 1 year

## 2013-05-04 IMAGING — CR DG CHEST 2V
2 series · 2 of 2 positions shown · non-contrast
Comparison: 10/17/2011

CLINICAL DATA: Chest pain

CHEST - 2 VIEW

[w chest pa]
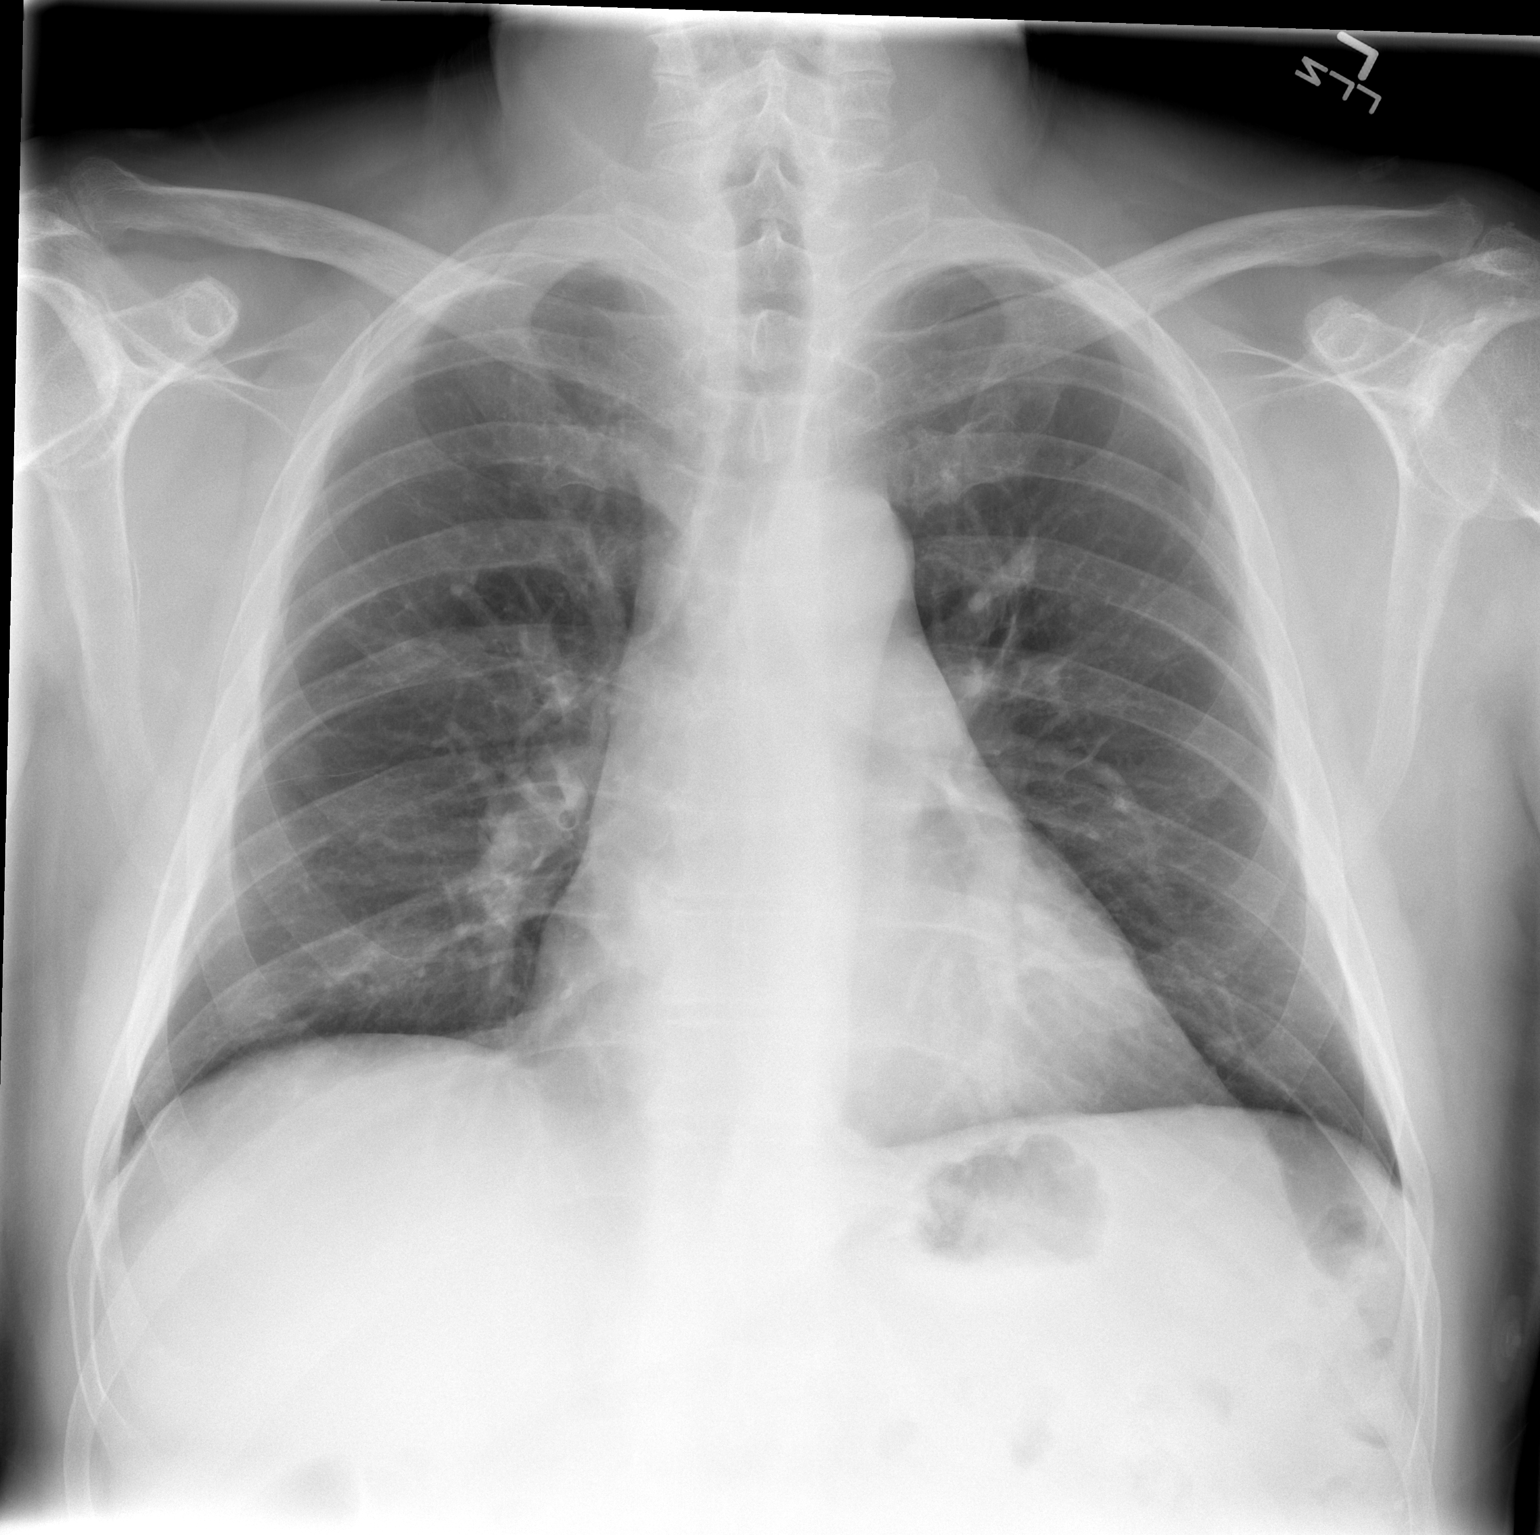

[w chest lat]
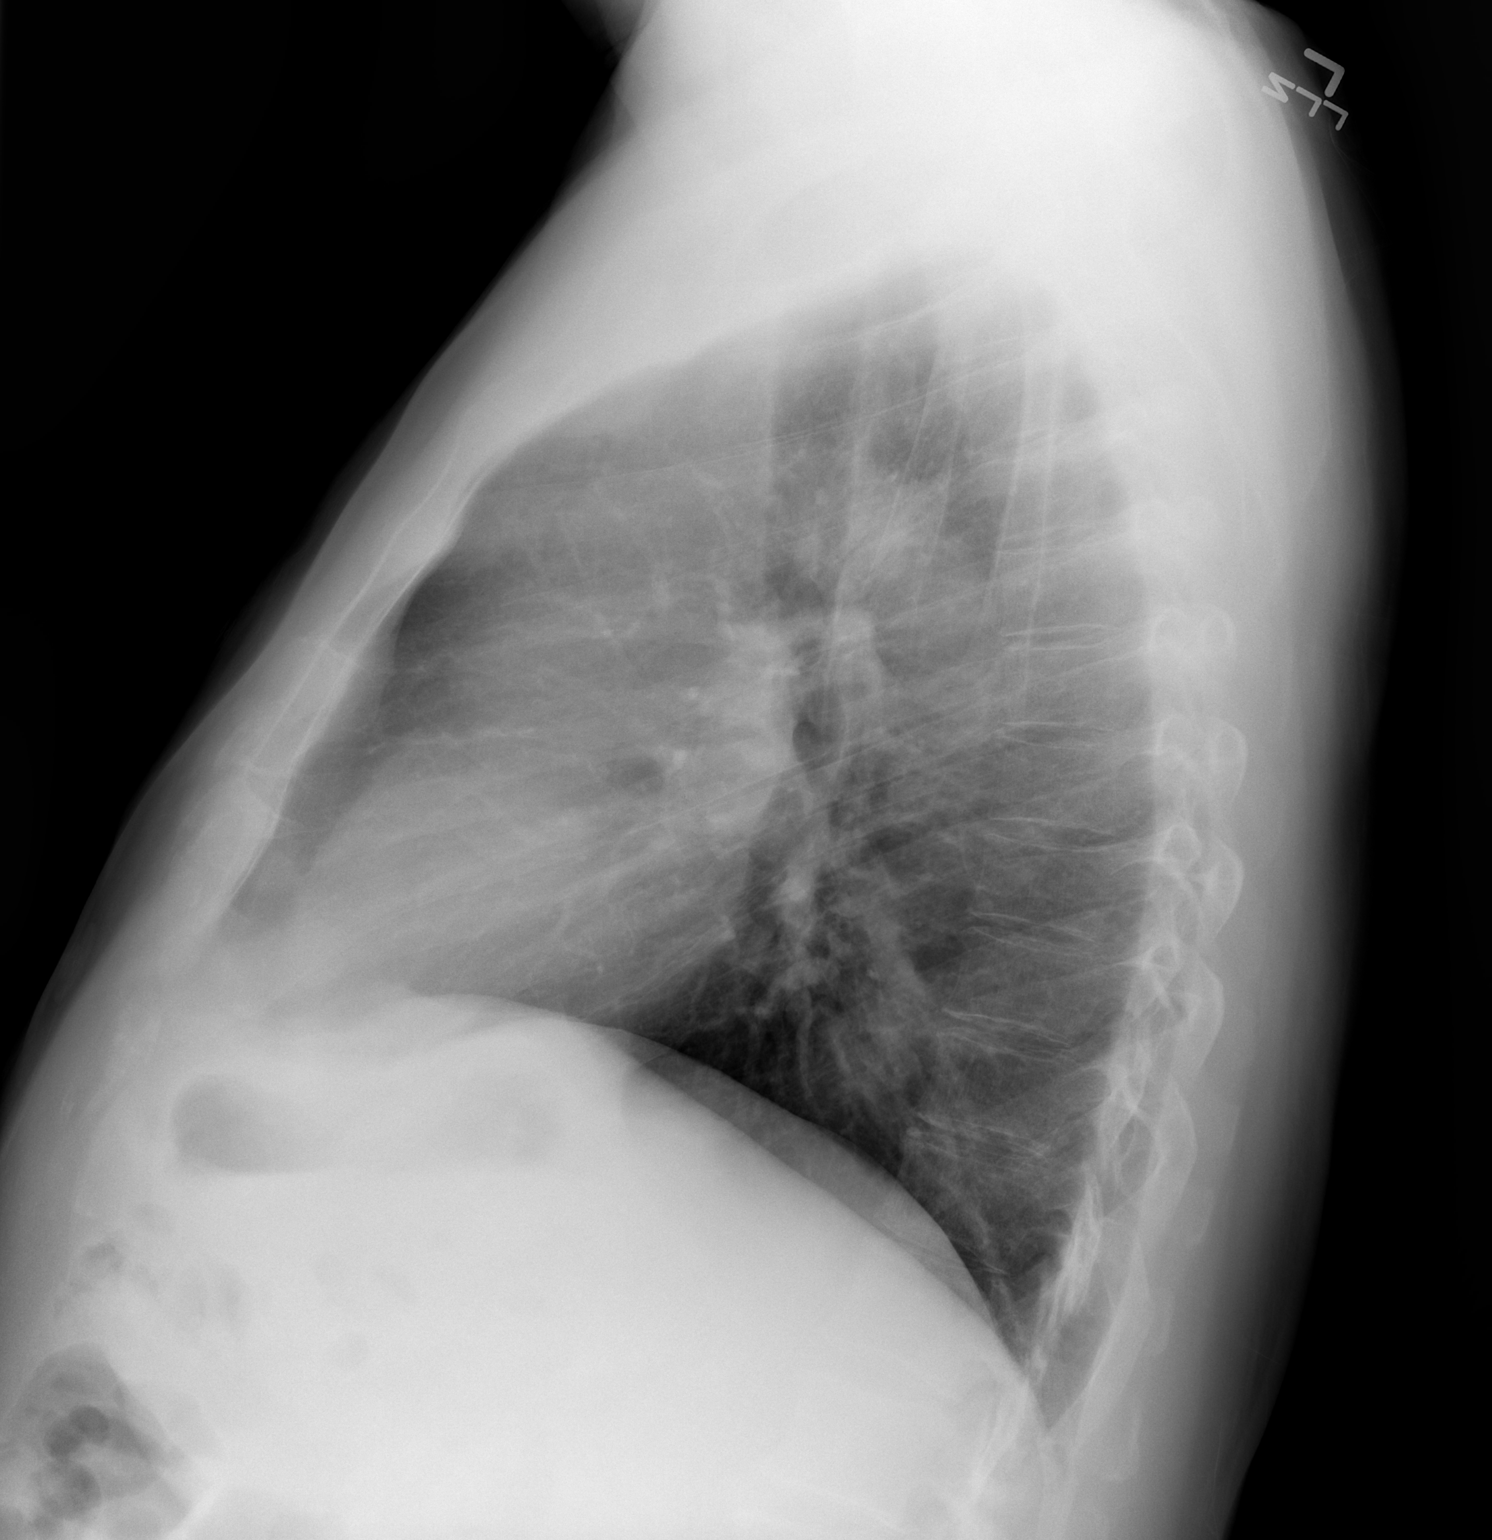

[2 of 2 positions shown; findings below may reference images not displayed]

FINDINGS: Lungs are essentially clear. No pleural effusion or
pneumothorax.

Stable mild cardiomegaly.

Visualized osseous structures are within normal limits.
IMPRESSION: No evidence of acute cardiopulmonary disease.

## 2013-05-23 ENCOUNTER — Ambulatory Visit (INDEPENDENT_AMBULATORY_CARE_PROVIDER_SITE_OTHER): Payer: Medicare Other | Admitting: Family Medicine

## 2013-05-23 VITALS — BP 150/80 | HR 62 | Temp 97.9°F | Resp 18 | Ht 73.0 in | Wt 210.0 lb

## 2013-05-23 DIAGNOSIS — M545 Low back pain, unspecified: Secondary | ICD-10-CM | POA: Diagnosis not present

## 2013-05-23 DIAGNOSIS — C61 Malignant neoplasm of prostate: Secondary | ICD-10-CM

## 2013-05-23 DIAGNOSIS — M533 Sacrococcygeal disorders, not elsewhere classified: Secondary | ICD-10-CM | POA: Diagnosis not present

## 2013-05-23 MED ORDER — KETOROLAC TROMETHAMINE 10 MG PO TABS: 10.0000 mg | ORAL_TABLET | Freq: Four times a day (QID) | ORAL | Status: DC | PRN

## 2013-05-23 MED ORDER — PREDNISONE 20 MG PO TABS: ORAL_TABLET | ORAL | Status: DC

## 2013-05-23 NOTE — Progress Notes (Signed)
Subjective:    Patient ID: Wesley Johnston, male    DOB: 1940/09/10, 73 y.o.   MRN: [id number] This chart was scribed for Wesley Honour, MD by [name], ED Scribe. This patient was seen in room 4 and the patient's care was started at 11:55 AM.  Chief Complaint  Patient presents with  . Tailbone Pain    treated at [facility] pain not resolved    HPI HPI Comments: Wesley Johnston is a 73 y.o. male with a h/o prostate cancer, CAD who presents to the [facility] complaining of coccyx pain that sometimes extends into his testicles. He states sitting on his tailbone and standing makes the pain worse. Walking makes the pain better. The pain wakes him up at night. He does not remember specific injury. Pt states he came here early September for left hip pain, had relief for a few weeks from chiropractor until the pain came back in his other hip and coccyx. He denies any hip pain at this time and states it has settled in his coccyx. He has been going to the [place] for pain management and further evaluation since 03/2013. He was given a shot of Toradol 6 days ago with complete relief for 6 hours. He is asking for Toradol PO. He finished a 6 day course of prednisone yesterday with temporary relief. He got hydrocodone 2 days ago from the [place] which offered little relief but GI side effects. They put him on etodolac and tramadol with minimal to no relief.  He states the pain sometimes extends into his right leg but not currently. He is peeing and pooping okay. He denies numbness or weakness, nausea, vomiting. He denies pain in his genitals besides when it radiates from his tailbone. Laying on stomach relieves pain at night.  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 [facility]; [facility] not severe enough for [facility]  and did not recommend injections. He is scheduled for another MRI pelvis? on 1/7 at the [place]. S/p coccyx xrays at [facility]; s/p genital exam and prostate exam at [facility] by PCP.  S/p u/a at [facility] and results pending.  Underwent C reactive protein that was normal. Also had a sed rate that was 6 - normal. Ionizied calcium that was elevated. He had a PTH that was elevated at 128. Had a reumatoid factor that was a negative. An ANA that was negative. Had an HLAB27 that was negative. His last PSA was in October and was 2.5. He has not had his prostate removed; observation only of prostate cancer at this time; taking Finasteride. He has also had skin cancer in moles removed.   Patient presenting today primarily for pain control.  Requesting Toradol po due to effectiveness of recent Toradol injection.  Etodolac, Tramadol, and Hydrocodone providing minimal relief.  PCP - [place]    Past Medical History  Diagnosis Date  . Coronary artery disease   . Hypertension   . Anginal pain   . Exertional dyspnea   . Hyperparathyroidism     "treated by the [place] for this"  . Prostate cancer 2010  . Malaria 1969  . Pneumonia ~ 2004    "walking pneumonia"  . Glaucoma     bilaterlly   Current Outpatient Prescriptions on File Prior to Visit  Medication Sig Dispense Refill  . amLODipine (NORVASC) 5 MG tablet Take 2.5 mg by mouth daily.      Marland Kitchen aspirin 81 MG EC tablet Take 81 mg by mouth daily. Swallow whole.      Marland Kitchen atorvastatin (LIPITOR) 40 MG tablet Take 1 tablet (40 mg total) by mouth daily at 6 PM.  30 tablet  11  . cholecalciferol (VITAMIN D) 1000 UNITS tablet Take 1,000 Units by mouth daily.      . diazepam (VALIUM) 2 MG tablet Take 1 tablet (2 mg total) by mouth at bedtime as needed (muscle spasm/pain). Take one tablet at bedtime for muscle spasm/pain  30 tablet  0  . finasteride (PROSCAR) 5 MG tablet Take 5 mg by mouth daily.      Marland Kitchen latanoprost (XALATAN) 0.005 % ophthalmic solution Place 1 drop into both eyes at  bedtime.      . Tamsulosin HCl (FLOMAX) 0.4 MG CAPS Take 0.4 mg by mouth daily after supper.        No current facility-administered medications on file prior to visit.   No Known Allergies    Review of Systems  Genitourinary: Negative for decreased urine volume.  Musculoskeletal: Positive for arthralgias (coccyx), back pain and myalgias. Negative for gait problem, joint swelling and neck pain.  Skin: Negative for rash.  Neurological: Negative for weakness and numbness.       Objective:   Physical Exam  Nursing note and vitals reviewed. Constitutional: He is oriented to person, place, and time. He appears well-developed and well-nourished. No distress.  HENT:  Head: Normocephalic and atraumatic.  Eyes: EOM are normal.  Neck: Neck supple. No tracheal deviation present.  Cardiovascular: Normal rate.   Pulmonary/Chest: Effort normal. No respiratory distress.  Musculoskeletal: Normal range of motion.  Full ROM lumbar spine. Minimal pain reproduced.  Straight leg raises are negative. Motor 5/5. Heel-toe walking intact. Marching is intact but marching makes pain worse.   Neurological: He is alert and oriented to person, place, and time.  Skin: Skin is warm and dry.  Psychiatric: He has a normal mood and affect. His behavior is normal.     Filed Vitals:   05/23/13 1054  BP: 150/80  Pulse: 62  Temp: 97.9 F (36.6 C)  TempSrc: Oral  Resp: 18  Height: 6\' 1"  (1.854 m)  Weight: 210 lb (95.255 kg)  SpO2: 99%        Assessment & Plan:   1. Low back pain associated with a spinal disorder other than radiculopathy or spinal stenosis   2. Low back pain   3. Coccyx pain   4. Prostate cancer    1.  Low back pain:  Persistent; s/p MRI lumbar spine that revealed spinal stenosis and moderate spondylosis.  MRI evaluated by NS who did not suggest surgery or consultation; will refer to Dr. Ellene Route for consultation to see if candidate for injections, etc.  Rx for Prednisone taper  provided; rx for Toradol also provided to use for five days only.   2.  Coccyx pain:  New to this provider; s/p lumbar MRI, coccyx xray by PCP per patient; scheduled for MRI pelvis this week; etiology unclear.  Patient inquiring about pudendal block; consider referral to pain management or Dr. Mina Marble of [place] if NS does not have any suggestions.   3.  Prostate cancer: stable; observation and Finasteride current treatment.    Meds ordered this encounter  Medications  . predniSONE (DELTASONE) 20 MG tablet  Sig: Three tablets daily x 1 day, then two tablets daily x 4 days then one tablet daily x 3 days    Dispense:  14 tablet    Refill:  0  . ketorolac (TORADOL) 10 MG tablet    Sig: Take 1 tablet (10 mg total) by mouth every 6 (six) hours as needed for moderate pain.    Dispense:  20 tablet    Refill:  0    I personally performed the services described in this documentation, which was scribed in my presence.  The recorded information has been reviewed and is accurate.  Reginia Forts, M.D.  [facility] [address] [place], [place]  [address] [phone] phone [phone] fax

## 2013-05-23 NOTE — Patient Instructions (Signed)
1.  DO NOT USE TORADOL WITH PREDNISONE.  USE EITHER MEDICATION; WHEN MEDICATION IS COMPLETED, YOU CAN START THE OTHER MEDICATION. 2.  WE WILL CONTACT YOU IN 1-2 WEEKS REGARDING APPOINTMENT WITH DR. Ellene Route.  IF DR. ELSNER DOES NOT FEEL THAT HE HAS ANYTHING TO OFFER, WE CAN REFER YOU TO A PAIN SPECIALIST (WHO PERFORMS INJECTIONS) OR DR. Mina Marble OF [facility].

## 2013-05-26 ENCOUNTER — Encounter: Payer: Self-pay | Admitting: Cardiovascular Disease

## 2013-07-07 ENCOUNTER — Telehealth: Payer: Self-pay | Admitting: *Deleted

## 2013-07-07 NOTE — Telephone Encounter (Signed)
Pt was calling in regards to medication changes. He wants to go on Lisinopril from the [place] and he wanted to know if it was ok to take it.  [place]

## 2013-07-07 NOTE — Telephone Encounter (Signed)
Spoke to patient. He states that he has been going to the [place] since last Nov because of his back .  per patient his blood pressure has been ranging in the 150's/ SBP. Per patient states [facility] --- started amlodipine 2.5 mg to 10 mg And also wanted to him to start lisinopril - he did not know mg.  Patient wanted to know what Dr Sallyanne Kuster thought of the situation.  RN informed patient will defer to Dr Sallyanne Kuster and call patient back

## 2013-07-07 NOTE — Telephone Encounter (Signed)
I see no problems with the choice of meds, but needs labs checked after a few weeks of lisinopril -worry about monitoring kidney function, especially while also taking NSAIDs like Toradol for his back

## 2013-07-08 NOTE — Telephone Encounter (Signed)
Called patient and told him Dr. Loletha Grayer OK with [facility] med choices but suggests he have lab work done a couple weeks after starting Lisinopril.  Patient voiced understanding and will have the [place] do the blood work.

## 2013-07-14 DIAGNOSIS — M8448XA Pathological fracture, other site, initial encounter for fracture: Secondary | ICD-10-CM | POA: Diagnosis not present

## 2013-07-14 DIAGNOSIS — I10 Essential (primary) hypertension: Secondary | ICD-10-CM | POA: Diagnosis not present

## 2013-07-14 DIAGNOSIS — E663 Overweight: Secondary | ICD-10-CM | POA: Diagnosis not present

## 2013-07-14 DIAGNOSIS — M48061 Spinal stenosis, lumbar region without neurogenic claudication: Secondary | ICD-10-CM | POA: Diagnosis not present

## 2013-07-23 DIAGNOSIS — M48061 Spinal stenosis, lumbar region without neurogenic claudication: Secondary | ICD-10-CM | POA: Diagnosis not present

## 2013-07-23 DIAGNOSIS — M47817 Spondylosis without myelopathy or radiculopathy, lumbosacral region: Secondary | ICD-10-CM | POA: Diagnosis not present

## 2013-09-23 DIAGNOSIS — Z6828 Body mass index (BMI) 28.0-28.9, adult: Secondary | ICD-10-CM | POA: Diagnosis not present

## 2013-09-23 DIAGNOSIS — M48061 Spinal stenosis, lumbar region without neurogenic claudication: Secondary | ICD-10-CM | POA: Diagnosis not present

## 2013-09-30 ENCOUNTER — Telehealth: Payer: Self-pay | Admitting: Cardiovascular Disease

## 2013-10-01 NOTE — Telephone Encounter (Signed)
Closed encounters °

## 2013-10-14 ENCOUNTER — Ambulatory Visit: Payer: Medicare Other | Admitting: Cardiovascular Disease

## 2013-12-10 ENCOUNTER — Ambulatory Visit (INDEPENDENT_AMBULATORY_CARE_PROVIDER_SITE_OTHER): Payer: Medicare Other | Admitting: Cardiovascular Disease

## 2013-12-10 ENCOUNTER — Encounter: Payer: Self-pay | Admitting: Cardiovascular Disease

## 2013-12-10 VITALS — BP 142/68 | HR 61 | Resp 16 | Ht 73.0 in | Wt 213.0 lb

## 2013-12-10 DIAGNOSIS — I10 Essential (primary) hypertension: Secondary | ICD-10-CM | POA: Diagnosis not present

## 2013-12-10 DIAGNOSIS — I2510 Atherosclerotic heart disease of native coronary artery without angina pectoris: Secondary | ICD-10-CM | POA: Diagnosis not present

## 2013-12-10 DIAGNOSIS — E785 Hyperlipidemia, unspecified: Secondary | ICD-10-CM

## 2013-12-10 DIAGNOSIS — Z79899 Other long term (current) drug therapy: Secondary | ICD-10-CM

## 2013-12-10 NOTE — Progress Notes (Signed)
Patient ID: Wesley Johnston, male   DOB: January 12, 1941, 73 y.o.   MRN: [id number]     Reason for office visit Coronary artery disease   Wesley Johnston presented with unstable angina in June 2013. His ECG stress test was abnormal. Cardiac catheterization showed a high-grade stenosis in the posterior descending artery branch of the right coronary artery and this was treated with placement of a drug-eluting stent. He has had no new events since that time. Lexiscan Myoview after the stent was normal. A calcified mildly aneurysmal distal abdominal aorta was incidentally noted on MRI of the lumbar spine, measuring only 3.8 cm in diameter (December 2014). A Holter monitor in 2014 showed mild sinus bradycardia and rare PVCs, no serious arrhythmia. He had symptomatic bradycardia with beta blockers. His lipid profile is excellent on the current regimen. His blood pressure is well controlled with systolic blood pressure usually in the 110s to 120s range. Blood pressure is a little high at times related to pain, never over 150. He has a history of treated prostate cancer.  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 He improves after epidural shots and is trying to avoid surgery, but seems to have some distal muscle weakness.   No Known Allergies  Current Outpatient Prescriptions  Medication Sig Dispense Refill  . amLODipine (NORVASC) 5 MG tablet Take 2.5 mg by mouth daily.      Marland Kitchen aspirin 81 MG EC tablet Take 81 mg by mouth daily. Swallow whole.      . cyanocobalamin 500 MCG tablet Take 500 mcg by mouth daily.      Marland Kitchen etodolac (LODINE) 200 MG capsule Take 200 mg by mouth 2 (two) times daily.      Marland Kitchen HYDROcodone-acetaminophen (NORCO/VICODIN) 5-325 MG per tablet Take 2 tablets by mouth 3 (three) times daily.      Marland Kitchen lisinopril (PRINIVIL,ZESTRIL) 10 MG tablet Take 10 mg by mouth daily.      . temazepam (RESTORIL) 7.5 MG capsule Take 7.5 mg by mouth  at bedtime as needed for sleep.      Marland Kitchen atorvastatin (LIPITOR) 40 MG tablet Take 1 tablet (40 mg total) by mouth daily at 6 PM.  30 tablet  11  . cholecalciferol (VITAMIN D) 1000 UNITS tablet Take 1,000 Units by mouth daily.      . finasteride (PROSCAR) 5 MG tablet Take 5 mg by mouth daily.      Marland Kitchen latanoprost (XALATAN) 0.005 % ophthalmic solution Place 1 drop into both eyes at bedtime.      . Tamsulosin HCl (FLOMAX) 0.4 MG CAPS Take 0.4 mg by mouth daily after supper.        No current facility-administered medications for this visit.    Past Medical History  Diagnosis Date  . Coronary artery disease   . Hypertension   . Anginal pain   . Exertional dyspnea   . Hyperparathyroidism     "treated by the [place] for this"  . Prostate cancer 2010  . Malaria 1969  . Pneumonia ~ 2004    "walking pneumonia"  . Glaucoma     bilaterlly    Past Surgical History  Procedure Laterality Date  . Coronary angioplasty with stent placement  10/23/11    "1"  . Vasectomy  1982  . Prostate biopsy  ~ 2000; ~2005; 2010  . Mole excision      "multiple"  . Mohs surgery  ~ 2002; 2012  right neck; scalp    Family History  Problem Relation Age of Onset  . Alzheimer's disease Mother   . Heart attack Father     History   Social History  . Marital Status: Married    Spouse Name: N/A    Number of Children: N/A  . Years of Education: N/A   Occupational History  . Not on file.   Social History Main Topics  . Smoking status: Never Smoker   . Smokeless tobacco: Never Used  . Alcohol Use: 21.0 oz/week    35 Shots of liquor per week  . Drug Use: Yes    Special: Marijuana     Comment: 10/23/11 "quit marijuana more than 35 years ago"  . Sexual Activity: Not Currently   Other Topics Concern  . Not on file   Social History Narrative  . No narrative on file    Review of systems: Severe low back pain. Using a cane now. The patient specifically denies any chest pain at rest or with exertion,  dyspnea at rest or with exertion, orthopnea, paroxysmal nocturnal dyspnea, syncope, palpitations, focal neurological deficits, intermittent claudication, lower extremity edema, unexplained weight gain, cough, hemoptysis or wheezing.  The patient also denies abdominal pain, nausea, vomiting, dysphagia, diarrhea, constipation, polyuria, polydipsia, dysuria, hematuria, frequency, urgency, abnormal bleeding or bruising, fever, chills, unexpected weight changes, mood swings, change in skin or hair texture, change in voice quality, auditory or visual problems, allergic reactions or rashes, new musculoskeletal complaints other than usual "aches and pains".   PHYSICAL EXAM BP 142/68  Pulse 61  Resp 16  Ht 6' 1" (1.854 m)  Wt 213 lb (96.616 kg)  BMI 28.11 kg/m2  General: Alert, oriented x3, no distress Head: no evidence of trauma, PERRL, EOMI, no exophtalmos or lid lag, no myxedema, no xanthelasma; normal ears, nose and oropharynx Neck: normal jugular venous pulsations and no hepatojugular reflux; brisk carotid pulses without delay and no carotid bruits Chest: clear to auscultation, no signs of consolidation by percussion or palpation, normal fremitus, symmetrical and full respiratory excursions Cardiovascular: normal position and quality of the apical impulse, regular rhythm, normal first and second heart sounds, no murmurs, rubs or gallops Abdomen: no tenderness or distention, no masses by palpation, no abnormal pulsatility or arterial bruits, normal bowel sounds, no hepatosplenomegaly Extremities: no clubbing, cyanosis or edema; 2+ radial, ulnar and brachial pulses bilaterally; 2+ right femoral, posterior tibial and dorsalis pedis pulses; 2+ left femoral, posterior tibial and dorsalis pedis pulses; no subclavian or femoral bruits Neurological: grossly nonfocal   EKG: NSR, nonspecific T wave changes (old)  Lipid Panel 2014 [facility] total cholesterol 133, HDL 64, LDL 58,  triglycerides 53  BMET    Component Value Date/Time   NA 138 04/13/2012 1249   K 3.8 04/13/2012 1249   CL 103 04/13/2012 1249   CO2 25 04/13/2012 1249   GLUCOSE 98 04/13/2012 1249   BUN 13 04/13/2012 1249   CREATININE 0.65 04/13/2012 1249   CALCIUM 11.5* 04/13/2012 1249   GFRNONAA >90 04/13/2012 1249   GFRAA >90 04/13/2012 1249     ASSESSMENT AND PLAN CAD- PDA DES 10/23/11. No other significant CAD. Myoview for chest pain July 2013 low risk  It has been two years since Mr. Minniefield underwent placement of a drug-eluting stent to the PDA branch of the right coronary artery. He is asymptomatic, but less physically active. Continue low-dose aspirin. He is intolerant to beta blockers (symptomatic bradycardia). Dyslipidemia, LDL 170 Oct 2012, LDL  58 on statin Jan 2014  Continue current statin regimen HTN (hypertension)  Well-controlled  Orders Placed This Encounter  Procedures  . Comp Met (CMET)  . Lipid Profile  . EKG 12-Lead   Meds ordered this encounter  Medications  . cyanocobalamin 500 MCG tablet    Sig: Take 500 mcg by mouth daily.  Marland Kitchen etodolac (LODINE) 200 MG capsule    Sig: Take 200 mg by mouth 2 (two) times daily.  . temazepam (RESTORIL) 7.5 MG capsule    Sig: Take 7.5 mg by mouth at bedtime as needed for sleep.  Marland Kitchen lisinopril (PRINIVIL,ZESTRIL) 10 MG tablet    Sig: Take 10 mg by mouth daily.  Marland Kitchen HYDROcodone-acetaminophen (NORCO/VICODIN) 5-325 MG per tablet    Sig: Take 2 tablets by mouth 3 (three) times daily.    Holli Humbles, MD, [place] [phone] office [phone] pager

## 2013-12-10 NOTE — Patient Instructions (Signed)
Your physician recommends that you return for lab work  Your physician recommends that you schedule a follow-up appointment in: One year with Dr. Sallyanne Kuster

## 2013-12-14 DIAGNOSIS — M48061 Spinal stenosis, lumbar region without neurogenic claudication: Secondary | ICD-10-CM | POA: Diagnosis not present

## 2013-12-14 DIAGNOSIS — IMO0002 Reserved for concepts with insufficient information to code with codable children: Secondary | ICD-10-CM | POA: Diagnosis not present

## 2013-12-14 DIAGNOSIS — M47817 Spondylosis without myelopathy or radiculopathy, lumbosacral region: Secondary | ICD-10-CM | POA: Diagnosis not present

## 2013-12-30 DIAGNOSIS — Z6828 Body mass index (BMI) 28.0-28.9, adult: Secondary | ICD-10-CM | POA: Diagnosis not present

## 2013-12-30 DIAGNOSIS — M48061 Spinal stenosis, lumbar region without neurogenic claudication: Secondary | ICD-10-CM | POA: Diagnosis not present

## 2013-12-30 DIAGNOSIS — I10 Essential (primary) hypertension: Secondary | ICD-10-CM | POA: Diagnosis not present

## 2014-01-01 DIAGNOSIS — M48061 Spinal stenosis, lumbar region without neurogenic claudication: Secondary | ICD-10-CM | POA: Diagnosis not present

## 2014-01-01 DIAGNOSIS — M47817 Spondylosis without myelopathy or radiculopathy, lumbosacral region: Secondary | ICD-10-CM | POA: Diagnosis not present

## 2014-01-01 DIAGNOSIS — M5137 Other intervertebral disc degeneration, lumbosacral region: Secondary | ICD-10-CM | POA: Diagnosis not present

## 2014-01-10 DIAGNOSIS — M48061 Spinal stenosis, lumbar region without neurogenic claudication: Secondary | ICD-10-CM | POA: Diagnosis not present

## 2014-01-10 DIAGNOSIS — Z6828 Body mass index (BMI) 28.0-28.9, adult: Secondary | ICD-10-CM | POA: Diagnosis not present

## 2014-01-11 ENCOUNTER — Other Ambulatory Visit: Payer: Self-pay | Admitting: Neurological Surgery

## 2014-01-14 ENCOUNTER — Encounter (HOSPITAL_COMMUNITY): Payer: Self-pay | Admitting: Pharmacy Technician

## 2014-01-19 ENCOUNTER — Other Ambulatory Visit (HOSPITAL_COMMUNITY): Payer: Medicare Other

## 2014-01-20 ENCOUNTER — Encounter (HOSPITAL_COMMUNITY): Payer: Self-pay

## 2014-01-20 ENCOUNTER — Encounter (HOSPITAL_COMMUNITY)
Admission: RE | Admit: 2014-01-20 | Discharge: 2014-01-20 | Disposition: A | Discharge status: Home / Self Care | Payer: Medicare Other | Source: Ambulatory Visit | Attending: Neurological Surgery | Admitting: Neurological Surgery

## 2014-01-20 ENCOUNTER — Ambulatory Visit (HOSPITAL_COMMUNITY)
Admission: RE | Admit: 2014-01-20 | Discharge: 2014-01-20 | Disposition: A | Discharge status: Home / Self Care | Payer: Medicare Other | Source: Ambulatory Visit | Attending: Anesthesiology | Admitting: Anesthesiology

## 2014-01-20 DIAGNOSIS — Z01818 Encounter for other preprocedural examination: Secondary | ICD-10-CM | POA: Insufficient documentation

## 2014-01-20 DIAGNOSIS — I10 Essential (primary) hypertension: Secondary | ICD-10-CM | POA: Insufficient documentation

## 2014-01-20 HISTORY — DX: Cardiac arrhythmia, unspecified: I49.9

## 2014-01-20 HISTORY — DX: Chronic kidney disease, unspecified: N18.9

## 2014-01-20 HISTORY — DX: Unspecified osteoarthritis, unspecified site: M19.90

## 2014-01-20 HISTORY — DX: Gastro-esophageal reflux disease without esophagitis: K21.9

## 2014-01-20 LAB — BASIC METABOLIC PANEL
Anion gap: 13 (ref 5–15)
BUN: 12 mg/dL (ref 6–23)
CO2: 25 mEq/L (ref 19–32)
Calcium: 10.8 mg/dL — ABNORMAL HIGH (ref 8.4–10.5)
Chloride: 103 mEq/L (ref 96–112)
Creatinine, Ser: 0.69 mg/dL (ref 0.50–1.35)
GLUCOSE: 101 mg/dL — AB (ref 70–99)
POTASSIUM: 4 meq/L (ref 3.7–5.3)
SODIUM: 141 meq/L (ref 137–147)

## 2014-01-20 LAB — CBC
HCT: 40.4 % (ref 39.0–52.0)
HEMOGLOBIN: 13.6 g/dL (ref 13.0–17.0)
MCH: 29.5 pg (ref 26.0–34.0)
MCHC: 33.7 g/dL (ref 30.0–36.0)
MCV: 87.6 fL (ref 78.0–100.0)
Platelets: 253 10*3/uL (ref 150–400)
RBC: 4.61 MIL/uL (ref 4.22–5.81)
RDW: 12.7 % (ref 11.5–15.5)
WBC: 6.5 10*3/uL (ref 4.0–10.5)

## 2014-01-20 LAB — SURGICAL PCR SCREEN
MRSA, PCR: NEGATIVE
STAPHYLOCOCCUS AUREUS: NEGATIVE

## 2014-01-20 NOTE — Progress Notes (Signed)
01/20/14 1426  OBSTRUCTIVE SLEEP APNEA  Have you ever been diagnosed with sleep apnea through a sleep study? No  Do you snore loudly (loud enough to be heard through closed doors)?  0  Do you often feel tired, fatigued, or sleepy during the daytime? 0  Has anyone observed you stop breathing during your sleep? 0  Do you have, or are you being treated for high blood pressure? 1  BMI more than 35 kg/m2? 0  Age over 71 years old? 1  Neck circumference greater than 40 cm/16 inches? 1  Gender: 1  Obstructive Sleep Apnea Score 4  Score 4 or greater  Results sent to PCP

## 2014-01-20 NOTE — Pre-Procedure Instructions (Signed)
Wesley Johnston  01/20/2014   Your procedure is scheduled on: Sept. 8 @ 240pm  Report to [facility] Admitting at 1145 AM.  Call this number if you have problems the morning of surgery: [phone]   Remember:   Do not eat food or drink liquids after midnight.   Take these medicines the morning of surgery with A SIP OF WATER: Amlodipine (Norvasc), Hydrocodone-acetaminophen (Norco) if needed, Finasteride (Proscar)   Stop taking Aspirin, Ibuprofen, Aleve, BC's, Goody's, Herbal medications, Fish Oil  Do not wear jewelry, make-up or nail polish.  Do not wear lotions, powders, or perfumes. You may wear deodorant.  Do not shave 48 hours prior to surgery. Men may shave face and neck.  Do not bring valuables to the hospital.  [facility] is not responsible for any belongings or valuables.               Contacts, dentures or bridgework may not be worn into surgery.  Leave suitcase in the car. After surgery it may be brought to your room.  For patients admitted to the hospital, discharge time is determined by your treatment team.               Patients discharged the day of surgery will not be allowed to drive home.     Special Instructions: [facility] - Preparing for Surgery  Before surgery, you can play an important role.  Because skin is not sterile, your skin needs to be as free of germs as possible.  You can reduce the number of germs on you skin by washing with CHG (chlorahexidine gluconate) soap before surgery.  CHG is an antiseptic cleaner which kills germs and bonds with the skin to continue killing germs even after washing.  Please DO NOT use if you have an allergy to CHG or antibacterial soaps.  If your skin becomes reddened/irritated stop using the CHG and inform your nurse when you arrive at Short Stay.  Do not shave (including legs and underarms) for at least 48 hours prior to the first CHG shower.  You may shave your face.  Please follow these instructions  carefully:   1.  Shower with CHG Soap the night before surgery and the morning of Surgery.  2.  If you choose to wash your hair, wash your hair first as usual with your  normal shampoo.  3.  After you shampoo, rinse your hair and body thoroughly to remove the   Shampoo.  4.  Use CHG as you would any other liquid soap.  You can apply chg directly to the skin and wash gently with scrungie or a clean washcloth.  5.  Apply the CHG Soap to your body ONLY FROM THE NECK DOWN.   Do not use on open wounds or open sores.  Avoid contact with your eyes,       ears, mouth and genitals (private parts).  Wash genitals (private parts) with your normal soap.  6.  Wash thoroughly, paying special attention to the area where your surgery  will be performed.  7.  Thoroughly rinse your body with warm water from the neck down.  8.  DO NOT shower/wash with your normal soap after using and rinsing off  the CHG Soap.  9.  Pat yourself dry with a clean towel.            10.  Wear clean pajamas.            11.  Place clean sheets on your bed the night of your first shower and do not sleep with pets.  Day of Surgery  Do not apply any lotions/deoderants the morning of surgery.  Please wear clean clothes to the hospital/surgery center.     Please read over the following fact sheets that you were given: Pain Booklet, Coughing and Deep Breathing and Surgical Site Infection Prevention

## 2014-01-20 NOTE — Progress Notes (Signed)
PCP is Dr, Marjo Bicker at the [facility] Cardiologist is Dr Sallyanne Kuster,  Card cath noted in epic form 2013. Denies having a recent CXR.

## 2014-01-21 ENCOUNTER — Encounter (HOSPITAL_COMMUNITY): Payer: Self-pay

## 2014-01-21 NOTE — Progress Notes (Signed)
Anesthesia Chart Review:  Patient is a 73 year old male scheduled for L3-4 laminectomy on 01/25/14 by Dr. Ellene Route.  History includes non-smoker, HTN, unstable angina with CAD s/p DES PDA '13, dysrhythmia (not specified; symptomatic bradycardia on b-blocker therapy), exertional dyspnea, hyperparathyroidism (treated at [facility]), prostate cancer '10, malaria '69, PNA '04, GERD, horseshoe kidney, glaucoma, arthritis, skin cancer s/p Moh's. OSA screening score was 4. Cardiologist is Dr. Sallyanne Kuster, last visit 12/10/13 with no new testing ordered. PCP is listed as Dr. Elder Cyphers. Also seen at [facility] or out-patient [facility] in [place].  EKG on 12/10/13 showed: NSR, non-specific T wave abnormality.  According to Dr. Victorino December notes, "Lexiscan Myoview after the stent was normal. A calcified mildly aneurysmal distal abdominal aorta was incidentally noted on MRI of the lumbar spine, measuring only 3.8 cm in diameter (December 2014). A Holter monitor in 2014 showed mild sinus bradycardia and rare PVCs, no serious arrhythmia. He had symptomatic bradycardia with beta blockers."   Nuclear stress test on 11/22/11 (Dr. Sallyanne Kuster) showed: Small amount of basal inferior bowel artifact. No reversible ischemia. Post-stress EF 58%. No significant wall motion abnormalities noted. Low risk scan.  Cardiac cath on 10/23/11 (Dr. Sallyanne Kuster; done due to chest pain with abnormal stress test) showed:  Angiographic Findings:  1. The left main coronary artery is free of significant atherosclerotic irregularities and bifurcates in the usual fashion into the left anterior descending artery and left circumflex coronary artery. Mild calcification is noted.  2. The left anterior descending artery is a large vessel that reaches the apex and generates Three major diagonal branches. There is evidence of minimal luminal irregularities and no calcification. No hemodynamically meaningful stenoses are seen.  3. The left circumflex coronary artery is a  large-size vessel non dominant vessel that generates three major oblque marginal arteries. There is evidence of minimal luminal irregularities and no calcification. No hemodynamically meaningful stenoses are seen.  4. The right coronary artery is a large-size dominant vessel that generates a large posterior lateral ventricular system as well as the posterior descending artery. There is evidence of some luminal irregularities and minimal calcification. There is a 85% ostial stenosis of the posterior descending artery. No other hemodynamically meaningful stenoses are seen.  5. The left ventricle was not injected. There is no aortic valve stenosis by pullback. The left ventricular end-diastolic pressure is 14 mm Hg.   IMPRESSIONS:  High grade PDA stenosis, symptomatic  RECOMMENDATION:  PCI/stent of R-PDA. (He underwent successful PCI--Integrity Resolute DES--of the bifurcation RPDA/RPAV to decrease 80-90% ostial RPDA lesion to 0% stenosis by Dr. Ellyn Hack.)  CXR on 01/20/14 showed: No active cardiopulmonary disease.  MIR of the lumbar sacral spine on 04/16/13 showed: Moderate degenerative disc disease at L5-S1 with mild bony central canal stenosis and 66mm retrolisthesis of L5 on S1. Changes of possible sacroilitis cannot be excluded. Possible 3.8 distal aortic aneurysm.  Preoperative labs noted.    Patient with cardiology follow-up with the past two months with no new testing ordered at that time.  Last stress test was just over two years ago.  If no acute changes then I would anticipate that he could proceed as planned.  Further evaluation by his assigned anesthesiologist on the day of surgery.  George Hugh [facility] Stay [facility]/Anesthesiology Phone [phone] 01/21/2014 12:48 PM

## 2014-01-24 MED ORDER — CEFAZOLIN SODIUM-DEXTROSE 2-3 GM-% IV SOLR
2.0000 g | INTRAVENOUS | Status: AC
  Administered 2014-01-25: 2 g via INTRAVENOUS
  Filled 2014-01-24: qty 50

## 2014-01-25 ENCOUNTER — Ambulatory Visit (HOSPITAL_COMMUNITY): Payer: Medicare Other

## 2014-01-25 ENCOUNTER — Ambulatory Visit (HOSPITAL_COMMUNITY)
Admission: RE | Admit: 2014-01-25 | Discharge: 2014-01-26 | Disposition: A | Discharge status: Home / Self Care | Payer: Medicare Other | Source: Ambulatory Visit | Attending: Neurological Surgery | Admitting: Neurological Surgery

## 2014-01-25 ENCOUNTER — Encounter (HOSPITAL_COMMUNITY): Payer: Medicare Other | Admitting: Vascular Surgery

## 2014-01-25 ENCOUNTER — Encounter (HOSPITAL_COMMUNITY)
Admission: RE | Disposition: A | Discharge status: Home / Self Care | Payer: Self-pay | Source: Ambulatory Visit | Attending: Neurological Surgery

## 2014-01-25 ENCOUNTER — Ambulatory Visit (HOSPITAL_COMMUNITY): Payer: Medicare Other | Admitting: Anesthesiology

## 2014-01-25 ENCOUNTER — Encounter (HOSPITAL_COMMUNITY): Payer: Self-pay | Admitting: Certified Registered"

## 2014-01-25 DIAGNOSIS — H409 Unspecified glaucoma: Secondary | ICD-10-CM | POA: Diagnosis not present

## 2014-01-25 DIAGNOSIS — N189 Chronic kidney disease, unspecified: Secondary | ICD-10-CM | POA: Insufficient documentation

## 2014-01-25 DIAGNOSIS — M129 Arthropathy, unspecified: Secondary | ICD-10-CM | POA: Diagnosis not present

## 2014-01-25 DIAGNOSIS — M48061 Spinal stenosis, lumbar region without neurogenic claudication: Secondary | ICD-10-CM | POA: Diagnosis not present

## 2014-01-25 DIAGNOSIS — I129 Hypertensive chronic kidney disease with stage 1 through stage 4 chronic kidney disease, or unspecified chronic kidney disease: Secondary | ICD-10-CM | POA: Insufficient documentation

## 2014-01-25 DIAGNOSIS — R0989 Other specified symptoms and signs involving the circulatory and respiratory systems: Secondary | ICD-10-CM | POA: Diagnosis not present

## 2014-01-25 DIAGNOSIS — E213 Hyperparathyroidism, unspecified: Secondary | ICD-10-CM | POA: Insufficient documentation

## 2014-01-25 DIAGNOSIS — K219 Gastro-esophageal reflux disease without esophagitis: Secondary | ICD-10-CM | POA: Insufficient documentation

## 2014-01-25 DIAGNOSIS — I209 Angina pectoris, unspecified: Secondary | ICD-10-CM | POA: Diagnosis not present

## 2014-01-25 DIAGNOSIS — M47817 Spondylosis without myelopathy or radiculopathy, lumbosacral region: Secondary | ICD-10-CM | POA: Insufficient documentation

## 2014-01-25 DIAGNOSIS — Z8546 Personal history of malignant neoplasm of prostate: Secondary | ICD-10-CM | POA: Insufficient documentation

## 2014-01-25 DIAGNOSIS — Z8613 Personal history of malaria: Secondary | ICD-10-CM | POA: Diagnosis not present

## 2014-01-25 DIAGNOSIS — I2510 Atherosclerotic heart disease of native coronary artery without angina pectoris: Secondary | ICD-10-CM | POA: Insufficient documentation

## 2014-01-25 DIAGNOSIS — M48062 Spinal stenosis, lumbar region with neurogenic claudication: Secondary | ICD-10-CM | POA: Diagnosis present

## 2014-01-25 DIAGNOSIS — R0609 Other forms of dyspnea: Secondary | ICD-10-CM | POA: Diagnosis not present

## 2014-01-25 DIAGNOSIS — M4310 Spondylolisthesis, site unspecified: Secondary | ICD-10-CM | POA: Diagnosis not present

## 2014-01-25 DIAGNOSIS — M1990 Unspecified osteoarthritis, unspecified site: Secondary | ICD-10-CM | POA: Diagnosis not present

## 2014-01-25 SURGERY — LUMBAR LAMINECTOMY WITH COFLEX 1 LEVEL
Anesthesia: General | Site: Spine Lumbar

## 2014-01-25 MED ORDER — THROMBIN 5000 UNITS EX SOLR
CUTANEOUS | Status: DC | PRN
  Administered 2014-01-25 (×2): 5000 [IU] via TOPICAL

## 2014-01-25 MED ORDER — DEXAMETHASONE SODIUM PHOSPHATE 4 MG/ML IJ SOLN
INTRAMUSCULAR | Status: DC | PRN
  Administered 2014-01-25: 8 mg via INTRAVENOUS

## 2014-01-25 MED ORDER — FENTANYL CITRATE 0.05 MG/ML IJ SOLN
INTRAMUSCULAR | Status: AC
  Filled 2014-01-25: qty 2

## 2014-01-25 MED ORDER — LIDOCAINE HCL (CARDIAC) 20 MG/ML IV SOLN
INTRAVENOUS | Status: DC | PRN
  Administered 2014-01-25: 100 mg via INTRAVENOUS

## 2014-01-25 MED ORDER — SODIUM CHLORIDE 0.9 % IJ SOLN
INTRAMUSCULAR | Status: AC
  Filled 2014-01-25: qty 10

## 2014-01-25 MED ORDER — SUFENTANIL CITRATE 50 MCG/ML IV SOLN
INTRAVENOUS | Status: AC
  Filled 2014-01-25: qty 1

## 2014-01-25 MED ORDER — METHOCARBAMOL 500 MG PO TABS
500.0000 mg | ORAL_TABLET | Freq: Four times a day (QID) | ORAL | Status: DC | PRN
  Administered 2014-01-25 – 2014-01-26 (×2): 500 mg via ORAL
  Filled 2014-01-25 (×2): qty 1

## 2014-01-25 MED ORDER — SODIUM CHLORIDE 0.9 % IJ SOLN
3.0000 mL | Freq: Two times a day (BID) | INTRAMUSCULAR | Status: DC
  Administered 2014-01-25: 3 mL via INTRAVENOUS

## 2014-01-25 MED ORDER — LIDOCAINE-EPINEPHRINE 1 %-1:100000 IJ SOLN
INTRAMUSCULAR | Status: DC | PRN
  Administered 2014-01-25: 5 mL

## 2014-01-25 MED ORDER — ACETAMINOPHEN 325 MG PO TABS
650.0000 mg | ORAL_TABLET | ORAL | Status: DC | PRN
Start: 2014-01-25 — End: 2014-01-26

## 2014-01-25 MED ORDER — HEMOSTATIC AGENTS (NO CHARGE) OPTIME
TOPICAL | Status: DC | PRN
  Administered 2014-01-25: 1 via TOPICAL

## 2014-01-25 MED ORDER — MORPHINE SULFATE 2 MG/ML IJ SOLN: 1.0000 mg | INTRAMUSCULAR | Status: DC | PRN

## 2014-01-25 MED ORDER — ROCURONIUM BROMIDE 50 MG/5ML IV SOLN
INTRAVENOUS | Status: AC
  Filled 2014-01-25: qty 1

## 2014-01-25 MED ORDER — ALUM & MAG HYDROXIDE-SIMETH 200-200-20 MG/5ML PO SUSP: 30.0000 mL | Freq: Four times a day (QID) | ORAL | Status: DC | PRN

## 2014-01-25 MED ORDER — FINASTERIDE 5 MG PO TABS
5.0000 mg | ORAL_TABLET | Freq: Every day | ORAL | Status: DC
  Administered 2014-01-25: 5 mg via ORAL
  Filled 2014-01-25 (×2): qty 1

## 2014-01-25 MED ORDER — SODIUM CHLORIDE 0.9 % IJ SOLN: 3.0000 mL | INTRAMUSCULAR | Status: DC | PRN

## 2014-01-25 MED ORDER — KETOROLAC TROMETHAMINE 15 MG/ML IJ SOLN
15.0000 mg | Freq: Four times a day (QID) | INTRAMUSCULAR | Status: DC
  Administered 2014-01-25 – 2014-01-26 (×3): 15 mg via INTRAVENOUS
  Filled 2014-01-25 (×5): qty 1

## 2014-01-25 MED ORDER — SUFENTANIL CITRATE 50 MCG/ML IV SOLN
INTRAVENOUS | Status: DC | PRN
  Administered 2014-01-25: 20 ug via INTRAVENOUS

## 2014-01-25 MED ORDER — METHOCARBAMOL 1000 MG/10ML IJ SOLN
500.0000 mg | Freq: Four times a day (QID) | INTRAVENOUS | Status: DC | PRN
  Filled 2014-01-25: qty 5

## 2014-01-25 MED ORDER — AMLODIPINE BESYLATE 2.5 MG PO TABS
2.5000 mg | ORAL_TABLET | Freq: Every day | ORAL | Status: DC
  Filled 2014-01-25: qty 1

## 2014-01-25 MED ORDER — ACETAMINOPHEN 10 MG/ML IV SOLN
INTRAVENOUS | Status: AC
  Administered 2014-01-25: 1000 mg via INTRAVENOUS
  Filled 2014-01-25: qty 100

## 2014-01-25 MED ORDER — PROPOFOL 10 MG/ML IV BOLUS
INTRAVENOUS | Status: AC
  Filled 2014-01-25: qty 20

## 2014-01-25 MED ORDER — PROMETHAZINE HCL 25 MG/ML IJ SOLN: 6.2500 mg | INTRAMUSCULAR | Status: DC | PRN

## 2014-01-25 MED ORDER — PHENOL 1.4 % MT LIQD: 1.0000 | OROMUCOSAL | Status: DC | PRN

## 2014-01-25 MED ORDER — LATANOPROST 0.005 % OP SOLN
1.0000 [drp] | Freq: Every day | OPHTHALMIC | Status: DC
  Administered 2014-01-25: 1 [drp] via OPHTHALMIC
  Filled 2014-01-25: qty 2.5

## 2014-01-25 MED ORDER — FENTANYL CITRATE 0.05 MG/ML IJ SOLN
25.0000 ug | INTRAMUSCULAR | Status: DC | PRN
  Administered 2014-01-25 (×2): 50 ug via INTRAVENOUS

## 2014-01-25 MED ORDER — HYDROCODONE-ACETAMINOPHEN 5-325 MG PO TABS
2.0000 | ORAL_TABLET | Freq: Three times a day (TID) | ORAL | Status: DC
  Administered 2014-01-25 – 2014-01-26 (×2): 2 via ORAL
  Filled 2014-01-25 (×2): qty 2

## 2014-01-25 MED ORDER — TEMAZEPAM 7.5 MG PO CAPS: 7.5000 mg | ORAL_CAPSULE | Freq: Every evening | ORAL | Status: DC | PRN

## 2014-01-25 MED ORDER — TAMSULOSIN HCL 0.4 MG PO CAPS
0.4000 mg | ORAL_CAPSULE | Freq: Every day | ORAL | Status: DC
  Administered 2014-01-25: 0.4 mg via ORAL
  Filled 2014-01-25 (×2): qty 1

## 2014-01-25 MED ORDER — BUPIVACAINE HCL (PF) 0.5 % IJ SOLN
INTRAMUSCULAR | Status: DC | PRN
  Administered 2014-01-25: 20 mL
  Administered 2014-01-25: 5 mL

## 2014-01-25 MED ORDER — ONDANSETRON HCL 4 MG/2ML IJ SOLN
4.0000 mg | INTRAMUSCULAR | Status: DC | PRN
Start: 2014-01-25 — End: 2014-01-26

## 2014-01-25 MED ORDER — OXYCODONE-ACETAMINOPHEN 5-325 MG PO TABS: 1.0000 | ORAL_TABLET | ORAL | Status: DC | PRN

## 2014-01-25 MED ORDER — MEPERIDINE HCL 25 MG/ML IJ SOLN: 6.2500 mg | INTRAMUSCULAR | Status: DC | PRN

## 2014-01-25 MED ORDER — LISINOPRIL 10 MG PO TABS
10.0000 mg | ORAL_TABLET | Freq: Every day | ORAL | Status: DC
  Administered 2014-01-25: 10 mg via ORAL
  Filled 2014-01-25 (×2): qty 1

## 2014-01-25 MED ORDER — PROPOFOL 10 MG/ML IV BOLUS
INTRAVENOUS | Status: DC | PRN
  Administered 2014-01-25: 130 mg via INTRAVENOUS

## 2014-01-25 MED ORDER — ROCURONIUM BROMIDE 100 MG/10ML IV SOLN
INTRAVENOUS | Status: DC | PRN
  Administered 2014-01-25: 40 mg via INTRAVENOUS

## 2014-01-25 MED ORDER — 0.9 % SODIUM CHLORIDE (POUR BTL) OPTIME
TOPICAL | Status: DC | PRN
  Administered 2014-01-25: 1000 mL

## 2014-01-25 MED ORDER — SODIUM CHLORIDE 0.9 % IV SOLN: 250.0000 mL | INTRAVENOUS | Status: DC

## 2014-01-25 MED ORDER — CEFAZOLIN SODIUM 1-5 GM-% IV SOLN
1.0000 g | Freq: Three times a day (TID) | INTRAVENOUS | Status: AC
  Administered 2014-01-25 – 2014-01-26 (×2): 1 g via INTRAVENOUS
  Filled 2014-01-25 (×2): qty 50

## 2014-01-25 MED ORDER — DEXAMETHASONE SODIUM PHOSPHATE 4 MG/ML IJ SOLN
INTRAMUSCULAR | Status: AC
  Filled 2014-01-25: qty 2

## 2014-01-25 MED ORDER — PHENYLEPHRINE HCL 10 MG/ML IJ SOLN
INTRAMUSCULAR | Status: DC | PRN
  Administered 2014-01-25: 80 ug via INTRAVENOUS

## 2014-01-25 MED ORDER — PHENYLEPHRINE 40 MCG/ML (10ML) SYRINGE FOR IV PUSH (FOR BLOOD PRESSURE SUPPORT)
PREFILLED_SYRINGE | INTRAVENOUS | Status: AC
  Filled 2014-01-25: qty 10

## 2014-01-25 MED ORDER — ACETAMINOPHEN 650 MG RE SUPP: 650.0000 mg | RECTAL | Status: DC | PRN

## 2014-01-25 MED ORDER — LACTATED RINGERS IV SOLN
INTRAVENOUS | Status: DC
  Administered 2014-01-25 (×2): via INTRAVENOUS

## 2014-01-25 MED ORDER — SODIUM CHLORIDE 0.9 % IR SOLN
Status: DC | PRN
  Administered 2014-01-25: 15:00:00

## 2014-01-25 MED ORDER — MIDAZOLAM HCL 5 MG/5ML IJ SOLN
INTRAMUSCULAR | Status: DC | PRN
  Administered 2014-01-25: 2 mg via INTRAVENOUS

## 2014-01-25 MED ORDER — ONDANSETRON HCL 4 MG/2ML IJ SOLN
INTRAMUSCULAR | Status: DC | PRN
  Administered 2014-01-25: 4 mg via INTRAVENOUS

## 2014-01-25 MED ORDER — MIDAZOLAM HCL 2 MG/2ML IJ SOLN
INTRAMUSCULAR | Status: AC
  Filled 2014-01-25: qty 2

## 2014-01-25 MED ORDER — LIDOCAINE HCL (CARDIAC) 20 MG/ML IV SOLN
INTRAVENOUS | Status: AC
  Filled 2014-01-25: qty 5

## 2014-01-25 MED ORDER — ONDANSETRON HCL 4 MG/2ML IJ SOLN
INTRAMUSCULAR | Status: AC
  Filled 2014-01-25: qty 2

## 2014-01-25 MED ORDER — MENTHOL 3 MG MT LOZG: 1.0000 | LOZENGE | OROMUCOSAL | Status: DC | PRN

## 2014-01-25 SURGICAL SUPPLY — 66 items
ADH SKN CLS APL DERMABOND .7 (GAUZE/BANDAGES/DRESSINGS) ×1
BAG DECANTER FOR FLEXI CONT (MISCELLANEOUS) ×3 IMPLANT
BLADE SURG 10 STRL SS (BLADE) ×2 IMPLANT
BLADE SURG ROTATE 9660 (MISCELLANEOUS) IMPLANT
BUR ACORN 6.0 (BURR) IMPLANT
BUR ACORN 6.0MM (BURR)
BUR MATCHSTICK NEURO 3.0 LAGG (BURR) ×3 IMPLANT
CANISTER SUCT 3000ML (MISCELLANEOUS) ×3 IMPLANT
CONT SPEC 4OZ CLIKSEAL STRL BL (MISCELLANEOUS) ×3 IMPLANT
DECANTER SPIKE VIAL GLASS SM (MISCELLANEOUS) ×3 IMPLANT
DERMABOND ADVANCED (GAUZE/BANDAGES/DRESSINGS) ×2
DERMABOND ADVANCED .7 DNX12 (GAUZE/BANDAGES/DRESSINGS) ×1 IMPLANT
DEVICE COFLEX STABLIZATION 10M (Neuro Prosthesis/Implant) ×2 IMPLANT
DRAPE C-ARM 42X72 X-RAY (DRAPES) ×2 IMPLANT
DRAPE C-ARMOR (DRAPES) ×3 IMPLANT
DRAPE LAPAROTOMY T 102X78X121 (DRAPES) ×3 IMPLANT
DRAPE MICROSCOPE LEICA (MISCELLANEOUS) IMPLANT
DRAPE POUCH INSTRU U-SHP 10X18 (DRAPES) ×3 IMPLANT
DRAPE PROXIMA HALF (DRAPES) ×2 IMPLANT
DRSG OPSITE POSTOP 3X4 (GAUZE/BANDAGES/DRESSINGS) ×2 IMPLANT
DURAPREP 26ML APPLICATOR (WOUND CARE) ×3 IMPLANT
ELECT REM PT RETURN 9FT ADLT (ELECTROSURGICAL) ×3
ELECTRODE REM PT RTRN 9FT ADLT (ELECTROSURGICAL) ×1 IMPLANT
GAUZE SPONGE 4X4 12PLY STRL (GAUZE/BANDAGES/DRESSINGS) ×3 IMPLANT
GAUZE SPONGE 4X4 16PLY XRAY LF (GAUZE/BANDAGES/DRESSINGS) IMPLANT
GLOVE BIO SURGEON STRL SZ 6.5 (GLOVE) ×1 IMPLANT
GLOVE BIO SURGEONS STRL SZ 6.5 (GLOVE) ×1
GLOVE BIOGEL PI IND STRL 7.0 (GLOVE) IMPLANT
GLOVE BIOGEL PI IND STRL 7.5 (GLOVE) IMPLANT
GLOVE BIOGEL PI IND STRL 8 (GLOVE) IMPLANT
GLOVE BIOGEL PI IND STRL 8.5 (GLOVE) ×1 IMPLANT
GLOVE BIOGEL PI INDICATOR 7.0 (GLOVE) ×2
GLOVE BIOGEL PI INDICATOR 7.5 (GLOVE) ×2
GLOVE BIOGEL PI INDICATOR 8 (GLOVE) ×6
GLOVE BIOGEL PI INDICATOR 8.5 (GLOVE) ×2
GLOVE ECLIPSE 7.0 STRL STRAW (GLOVE) ×2 IMPLANT
GLOVE ECLIPSE 7.5 STRL STRAW (GLOVE) ×6 IMPLANT
GLOVE ECLIPSE 8.5 STRL (GLOVE) ×3 IMPLANT
GLOVE EXAM NITRILE LRG STRL (GLOVE) IMPLANT
GLOVE EXAM NITRILE MD LF STRL (GLOVE) IMPLANT
GLOVE EXAM NITRILE XL STR (GLOVE) IMPLANT
GLOVE EXAM NITRILE XS STR PU (GLOVE) IMPLANT
GOWN STRL REUS W/ TWL LRG LVL3 (GOWN DISPOSABLE) IMPLANT
GOWN STRL REUS W/ TWL XL LVL3 (GOWN DISPOSABLE) IMPLANT
GOWN STRL REUS W/TWL 2XL LVL3 (GOWN DISPOSABLE) ×7 IMPLANT
GOWN STRL REUS W/TWL LRG LVL3 (GOWN DISPOSABLE) ×6
GOWN STRL REUS W/TWL XL LVL3 (GOWN DISPOSABLE)
KIT BASIN OR (CUSTOM PROCEDURE TRAY) ×3 IMPLANT
KIT ROOM TURNOVER OR (KITS) ×3 IMPLANT
NDL SPNL 20GX3.5 QUINCKE YW (NEEDLE) IMPLANT
NEEDLE HYPO 22GX1.5 SAFETY (NEEDLE) ×3 IMPLANT
NEEDLE SPNL 20GX3.5 QUINCKE YW (NEEDLE) IMPLANT
NS IRRIG 1000ML POUR BTL (IV SOLUTION) ×3 IMPLANT
PACK LAMINECTOMY NEURO (CUSTOM PROCEDURE TRAY) ×3 IMPLANT
PAD ARMBOARD 7.5X6 YLW CONV (MISCELLANEOUS) ×9 IMPLANT
PATTIES SURGICAL .5 X1 (DISPOSABLE) ×1 IMPLANT
RUBBERBAND STERILE (MISCELLANEOUS) IMPLANT
SPONGE SURGIFOAM ABS GEL SZ50 (HEMOSTASIS) ×3 IMPLANT
SUT VIC AB 1 CT1 18XBRD ANBCTR (SUTURE) ×1 IMPLANT
SUT VIC AB 1 CT1 8-18 (SUTURE) ×3
SUT VIC AB 2-0 CP2 18 (SUTURE) ×3 IMPLANT
SUT VIC AB 3-0 SH 8-18 (SUTURE) ×3 IMPLANT
SYR 20ML ECCENTRIC (SYRINGE) ×3 IMPLANT
TOWEL OR 17X24 6PK STRL BLUE (TOWEL DISPOSABLE) ×3 IMPLANT
TOWEL OR 17X26 10 PK STRL BLUE (TOWEL DISPOSABLE) ×3 IMPLANT
WATER STERILE IRR 1000ML POUR (IV SOLUTION) ×3 IMPLANT

## 2014-01-25 NOTE — H&P (Signed)
Wesley Johnston is an 73 y.o. male.   Chief Complaint: Back and bilateral lower extremity pain and weakness HPI: Asian is a 73 year old individual who's had significant back and bilateral lower extremity pain weakness. He has evidence of multiple levels of spondylosis but the singular worst area of stenosis is at L3-L4. He is failed efforts at conservative management has become progressive weakness of the legs. Been advised as to having a decompression at L3-L4 followed by cold flex implantation to stabilize his spine.  Past Medical History  Diagnosis Date  . Coronary artery disease   . Hypertension   . Anginal pain   . Exertional dyspnea   . Hyperparathyroidism     "treated by [facility] for this"  . Prostate cancer 2010  . Malaria 1969  . Pneumonia ~ 2004    "walking pneumonia"  . Glaucoma     bilaterlly  . Dysrhythmia   . Chronic kidney disease     Horse shoe shaped kidney  . GERD (gastroesophageal reflux disease)   . Arthritis     Past Surgical History  Procedure Laterality Date  . Coronary angioplasty with stent placement  10/23/11    "1"  . Vasectomy  1982  . Prostate biopsy  ~ 2000; ~2005; 2010  . Mole excision      "multiple"  . Mohs surgery  ~ 2002; 2012    right neck; scalp    Family History  Problem Relation Age of Onset  . Alzheimer's disease Mother   . Heart attack Father    Social History:  reports that he has never smoked. He has never used smokeless tobacco. He reports that he drinks about 8.4 ounces of alcohol per week. He reports that he uses illicit drugs (Marijuana).  Allergies: No Known Allergies  Medications Prior to Admission  Medication Sig Dispense Refill  . amLODipine (NORVASC) 5 MG tablet Take 2.5 mg by mouth daily.      Marland Kitchen atorvastatin (LIPITOR) 40 MG tablet Take 40 mg by mouth daily at 6 PM.      . cholecalciferol (VITAMIN D) 1000 UNITS tablet Take 2,000 Units by mouth daily.       . Cholecalciferol (VITAMIN D) 2000 UNITS tablet Take 4,000 Units  by mouth daily.      . cyanocobalamin 500 MCG tablet Take 500 mcg by mouth daily.      Marland Kitchen etodolac (LODINE) 200 MG capsule Take 200 mg by mouth 2 (two) times daily.      . finasteride (PROSCAR) 5 MG tablet Take 5 mg by mouth daily.      Marland Kitchen HYDROcodone-acetaminophen (NORCO/VICODIN) 5-325 MG per tablet Take 2 tablets by mouth 3 (three) times daily.      Marland Kitchen latanoprost (XALATAN) 0.005 % ophthalmic solution Place 1 drop into both eyes at bedtime.      Marland Kitchen lisinopril (PRINIVIL,ZESTRIL) 10 MG tablet Take 10 mg by mouth daily.      . Tamsulosin HCl (FLOMAX) 0.4 MG CAPS Take 0.4 mg by mouth daily after supper.       . temazepam (RESTORIL) 7.5 MG capsule Take 7.5 mg by mouth at bedtime as needed for sleep.      Marland Kitchen aspirin 81 MG EC tablet Take 81 mg by mouth daily. Swallow whole.      Marland Kitchen ketorolac (TORADOL) 10 MG tablet Take 10 mg by mouth 2 (two) times daily.        No results found for this or any previous visit (from the past  48 hour(s)). No results found.  Review of Systems  Constitutional: Negative.   Eyes: Negative.   Respiratory: Negative.   Cardiovascular: Negative.   Genitourinary: Negative.   Musculoskeletal: Positive for back pain.       Leg pain bilaterally assistant with neurogenic  Skin: Negative.   Neurological: Positive for tingling, sensory change and focal weakness.  Endo/Heme/Allergies: Negative.   Psychiatric/Behavioral: Negative.     Blood pressure 141/80, pulse 66, temperature 98 F (36.7 C), temperature source Oral, resp. rate 20, weight 95.709 kg (211 lb), SpO2 96.00%. Physical Exam  Constitutional: He is oriented to person, place, and time. He appears well-developed and well-nourished.  HENT:  Head: Normocephalic.  Eyes: Conjunctivae and EOM are normal. Pupils are equal, round, and reactive to light.  Neck: Normal range of motion.  Cardiovascular: Normal rate, regular rhythm and normal heart sounds.   Respiratory: Effort normal.  GI: Soft. Bowel sounds are normal.   Musculoskeletal:  Centralized back pain to palpation and percussion. Decreased strength in tibialis anterior gastrocs bilaterally  Neurological: He is alert and oriented to person, place, and time. He has normal reflexes. He displays normal reflexes. No cranial nerve deficit. Coordination normal.  Skin: Skin is warm and dry.  Psychiatric: He has a normal mood and affect. His behavior is normal. Judgment and thought content normal.     Assessment/Plan Spondylosis stenosis with neurogenic claudication L3-L4.  Bilateral laminotomies and decompression L3-L4 placement of Coflex.  Emry Tobin J 01/25/2014, 12:57 PM

## 2014-01-25 NOTE — Transfer of Care (Signed)
Immediate Anesthesia Transfer of Care Note  Patient: Wesley Johnston  Procedure(s) Performed: Procedure(s) with comments: Lumbar three-four Laminectomy with Coflex (N/A) - L3-4 Laminectomy with Coflex  Patient Location: PACU  Anesthesia Type:General  Level of Consciousness: awake, alert  and oriented  Airway & Oxygen Therapy: Patient Spontanous Breathing and Patient connected to nasal cannula oxygen  Post-op Assessment: Report given to PACU RN, Post -op Vital signs reviewed and stable and Patient moving all extremities  Post vital signs: Reviewed and stable  Complications: No apparent anesthesia complications

## 2014-01-25 NOTE — Anesthesia Postprocedure Evaluation (Signed)
  Anesthesia Post-op Note  Patient: Wesley Johnston  Procedure(s) Performed: Procedure(s) with comments: Lumbar three-four Laminectomy with Coflex (N/A) - L3-4 Laminectomy with Coflex  Patient Location: PACU  Anesthesia Type:General  Level of Consciousness: awake, alert  and oriented  Airway and Oxygen Therapy: Patient Spontanous Breathing and Patient connected to nasal cannula oxygen  Post-op Pain: mild  Post-op Assessment: Post-op Vital signs reviewed  Post-op Vital Signs: Reviewed and stable  Last Vitals:  Filed Vitals:   01/25/14 1518  BP: 126/93  Pulse: 76  Temp: 36.5 C  Resp: 18    Complications: No apparent anesthesia complications

## 2014-01-25 NOTE — Progress Notes (Signed)
Patient ID: Wesley Johnston, male   DOB: [birth year], 73 y.o.   MRN: [id number] Vital signs are stable. Some soreness in left lower extremity. Motor function appears to be intact. Dressing is clean and dry. Stable postop

## 2014-01-25 NOTE — Op Note (Signed)
Date of surgery: 01/25/2014 Preoperative diagnosis: L3-L4 stenosis with neurogenic claudication, lumbar radiculopathy Postoperative diagnosis: L3-L4 stenosis with neurogenic claudication, lumbar radiculopathy Procedure: Bilateral laminotomies and foraminotomies L3-L4 decompression with Coflex Surgeon: Kristeen Miss Anesthesia: Gen. endotracheal Asst. Roanna Banning M.D. Indications: Patient is a 73 year old individual who's had significant problems with back pain and bilateral lower extremity pain. He Has multiple levels of spondylitic degeneration however the singular worst level is at L3-L4. Here he has both central canal stenosis and lateral recess stenosis. He has tried efforts at conservative management including a series of exercise programs steroid injections multiple medications and despite all efforts he has continued worsening symptoms. He's been experiencing some weakness in his legs. Been advised regarding surgical decompression and placement of a Coflex device.  Procedure: The patient was brought to the operating room supine on a stretcher.  After the smooth induction of general endotracheal anesthesia he was turned prone. The back was prepped with alcohol and DuraPrep and draped in a sterile fashion. Fluoroscopic guidance was used to localize the level of L3-L4. Skin over this area was incised after being infiltrated with 10 cc of lidocaine 1% with 1-100,000 epinephrine mixed 50-50 with half percent Marcaine. The lumbar dorsal fascia was dissected in a subperiosteal fashion of either side of the interlaminar space at L3-L4. A self-retaining retractor was placed in the wound. Then laminotomies were created removing the inferior marginal lamina of L3 out to the medial wall the facet. On the left side there was noted to be a significant lateral recess stenosis on the right side there was noted be markedly hypertrophy of the facet joint contributing to stenosis and this also thicken the  inferior marginal of the lamina at L3. This was all dissected free and the common dural tube was decompressed and the lateral recesses were decompressed decompressing inferiorly the L4 nerve root and superiorly the L3 nerve root on the right side on the left side a similar decompression using a 2 and 3 mm Kerrison punch was performed. Once the central canal and lateral recesses were well decompressed and the interlaminar and interspinous ligament was taken out. Then using fluoroscopic guidance the 10 mm Coflex trial was fitted. The actual device was then prepared by widening the wings and then impacting the Coflex device into the internal laminar and interspinous space. The wings were then tightened. Final confirmation of the position of the Coflex was obtained radiographically. The lumbar dorsal fascia was then closed with 2-0 Vicryl in interrupted fashion 2-0 Vicryl was used in the subcuticular tissues 3-0 Vicryl for the final closure. Dermabond was placed on the skin. The patient tolerated procedure well.

## 2014-01-25 NOTE — Anesthesia Preprocedure Evaluation (Addendum)
Anesthesia Evaluation  Patient identified by MRN, date of birth, ID band Patient awake    Reviewed: Allergy & Precautions, H&P , NPO status   Airway Mallampati: II TM Distance: >3 FB     Dental  (+) Dental Advisory Given, Teeth Intact, Poor Dentition   Pulmonary  breath sounds clear to auscultation        Cardiovascular hypertension, Pt. on medications CAD: DES STENT low dose Asprin, Cardiologist 11/2013, no new studies ordered.  DES been in two years, Lexi scan post stent normal. Rhythm:Regular  Last EF 58% 2 years ago   Neuro/Psych    GI/Hepatic GERD-  Medicated,  Endo/Other    Renal/GU      Musculoskeletal   Abdominal (+)  Abdomen: soft.    Peds  Hematology   Anesthesia Other Findings   Reproductive/Obstetrics                         Anesthesia Physical Anesthesia Plan  ASA: III  Anesthesia Plan: General   Post-op Pain Management:    Induction: Intravenous  Airway Management Planned: Oral ETT  Additional Equipment:   Intra-op Plan:   Post-operative Plan: Extubation in OR  Informed Consent: I have reviewed the patients History and Physical, chart, labs and discussed the procedure including the risks, benefits and alternatives for the proposed anesthesia with the patient or authorized representative who has indicated his/her understanding and acceptance.     Plan Discussed with:   Anesthesia Plan Comments:         Anesthesia Quick Evaluation

## 2014-01-25 NOTE — Anesthesia Procedure Notes (Signed)
Procedure Name: Intubation Date/Time: 01/25/2014 1:58 PM Performed by: Melina Copa, Amina Menchaca R Pre-anesthesia Checklist: Patient identified, Emergency Drugs available, Suction available, Patient being monitored and Timeout performed Patient Re-evaluated:Patient Re-evaluated prior to inductionOxygen Delivery Method: Circle system utilized Preoxygenation: Pre-oxygenation with 100% oxygen Intubation Type: IV induction Ventilation: Mask ventilation without difficulty Laryngoscope Size: Mac and 4 Grade View: Grade I Tube type: Oral Tube size: 8.0 mm Number of attempts: 1 Airway Equipment and Method: Stylet Placement Confirmation: ETT inserted through vocal cords under direct vision and positive ETCO2 Secured at: 22 cm Tube secured with: Tape Dental Injury: Teeth and Oropharynx as per pre-operative assessment

## 2014-01-26 DIAGNOSIS — M47817 Spondylosis without myelopathy or radiculopathy, lumbosacral region: Secondary | ICD-10-CM | POA: Diagnosis not present

## 2014-01-26 MED ORDER — HYDROCODONE-ACETAMINOPHEN 5-325 MG PO TABS: 1.0000 | ORAL_TABLET | Freq: Three times a day (TID) | ORAL | Status: DC

## 2014-01-26 MED ORDER — METHOCARBAMOL 500 MG PO TABS: 500.0000 mg | ORAL_TABLET | Freq: Four times a day (QID) | ORAL | Status: DC | PRN

## 2014-01-26 NOTE — Discharge Instructions (Signed)
Wound Care °Leave incision open to air. °You may shower. °Do not scrub directly on incision.  °Do not put any creams, lotions, or ointments on incision. °Activity °Walk each and every day, increasing distance each day. °No lifting greater than 5 lbs.  Avoid excessive neck motion. °No driving for 2 weeks; may ride as a passenger locally. °Wear neck brace at all times except when showering.  If provided soft collar, may wear for comfort unless otherwise instructed. °Diet °Resume your normal diet.  °Return to Work °Will be discussed at you follow up appointment. °Call Your Doctor If Any of These Occur °Redness, drainage, or swelling at the wound.  °Temperature greater than 101 degrees. °Severe pain not relieved by pain medication. °Increased difficulty swallowing. °Incision starts to come apart. °Follow Up Appt °Call today for appointment in 4 weeks ([phone]) or for problems.  If you have any hardware placed in your spine, you will need an x-ray before your appointment. ° ° ° °Laminectomy - Laminotomy - Discectomy °Your surgeon has decided that a laminectomy (entire lamina removal) or laminotomy (partial lamina removal) is the best treatment for your back problem. These procedures involve removal of bone to relieve pressure on nerve roots. It allows the surgeon access to parts of the spine where other problems are located. This could be an injured disc (the cartilage-like structures located between the bones of the back). In this surgery your surgeon removes a part of the boney arch that surrounds your spinal canal. This may be compressing nerve roots. In some cases, the surgeon will remove the disc and fuse (stick together) vertebral bodies (the bones of your back) to make the spine more stable. The type of procedure you will need is usually decided prior to surgery, however modifications may be necessary. The time in surgery depends on the findings in surgery and the procedure necessary to correct the  problems. °DISCECTOMY °For people with disc problems, the surgeon removes the portion of the disc that is causing the pressure on the nerve root. Some surgeons perform a micro (small) discectomy, which may require removal of only a small portion of the lamina. A disc nucleus (center) may also be removed either through a needle (percutaneous discectomy) or by injecting an enzyme called chymopapain into the disc. Chymopapain is an enzyme that dissolves the disc. For people with back instability, the surgeon fuses vertebrae that are next to each other with tiny pieces of bone. These are used as bone grafts on the facets, or between the vertebrae. When this heals, the bones will no longer be able to move. These bone chips are often taken from the pelvic bones. Bones and bone grafts grow into one unit, stabilizing the segments of the spinal column. °LET YOUR CAREGIVER KNOW ABOUT: °· Allergies. °· Medicines taken including herbs, eyedrops, over-the-counter medicines, and creams. °· Use of steroids (by mouth or creams). °· Previous problems with anesthetics or numbing medicine. °· Possibility of pregnancy, if this applies. °· History of blood clots (thrombophlebitis). °· History of bleeding or blood problems. °· Previous surgery. °· Other health problems. °RISKS AND COMPLICATIONS °Your caregiver will discuss possible risks and complications with you before surgery. In addition to the usual risks of anesthesia, other common risks and complications include: °· Blood loss and replacement. °· Temporary increase in pain due to surgery. °· Uncorrected back pain. °· Infection. °· New nerve damage (tingling, numbness, and pain). °BEFORE THE PROCEDURE °· Stop smoking at least 1 week prior to surgery. This lowers   risk during surgery. °· Your caregiver may advise that you stop taking certain medicines that may affect the outcome of the surgery and your ability to heal. For example, you may need to stop taking anti-inflammatories,  such as aspirin, because of possible bleeding problems. Other medicines may have interactions with anesthesia. °· Tell your caregiver if you have been on steroids for long periods of time. Often, additional steroids are administered intravenously before and during the procedure to prevent complications. °· You should be present 60 minutes prior to your procedure or as directed. °AFTER THE PROCEDURE °After surgery, you will be taken to the recovery area where a nurse will watch and check your progress. Generally, you will be allowed to go home within 1 week barring other problems. °HOME CARE INSTRUCTIONS  °· Check the surgical cut (incision) twice a day for signs of infection. Some signs may include a bad smelling, greenish or yellowish discharge from the wound; increased pain or increased redness over the incision site; an opening of the incision; flu-like symptoms; or a temperature above 101.5° F (38.6° C). °· Change your bandages in about 24 to 36 hours following surgery or as directed. °· You may shower once the bandage is removed or as directed. Avoid bathtubs, swimming pools, and hot tubs for 3 weeks or until your incision has healed completely. If you have stitches (sutures) or staples they may be removed 2 to 3 weeks after surgery, or as directed by your caregiver. °· Follow your caregiver's instructions for activities, exercises, and physical therapy. °· Weight reduction may be beneficial if you are overweight. °· Walking is permitted. You may use a treadmill without an incline. Cut down on activities if you have discomfort. You may also go up and down stairs as tolerated. °· Do not lift anything heavier than 10 to 15 pounds. Avoid bending or twisting at the waist. Always bend your knees. °· Maintain strength and range of motion as instructed. °· No driving is permitted for 2 to 3 weeks, or as directed by your caregiver. You may be a passenger for 20 to 30 minute trips. Lying back in the passenger seat may  be more comfortable for you. °· Limit your sitting to 20 to 30 minute intervals. You should lie down or walk in between sitting periods. There are no limitations for sitting in a recliner chair. °· Only take over-the-counter or prescription medicines for pain, discomfort, or fever as directed by your caregiver. °SEEK MEDICAL CARE IF:  °· There is increased bleeding (more than a small spot) from the wound. °· You notice redness, swelling, or increasing pain in the wound. °· Pus is coming from the wound. °· An unexplained oral temperature above 102° F (38.9° C) develops. °· You notice a bad smell coming from the wound or dressing. °SEEK IMMEDIATE MEDICAL CARE IF:  °· You develop a rash. °· You have difficulty breathing. °· You have any allergic problems. °Document Released: 05/03/2000 Document Revised: 09/20/2013 Document Reviewed: 03/01/2008 °[employer]® Patient Information ©2015 [employer]. This information is not intended to replace advice given to you by your health care provider. Make sure you discuss any questions you have with your health care provider. ° °

## 2014-01-26 NOTE — Progress Notes (Signed)
Patient ID: Wesley Johnston, male   DOB: 03-14-1941, 73 y.o.   MRN: [id number] Vital signs are stable. Back pain moderate. Left side feels well, right side still has some soreness in the thigh. Incision is clean and dry Dressing intact Discharge home

## 2014-01-26 NOTE — Evaluation (Signed)
Physical Therapy Evaluation Patient Details Name: Wesley Johnston MRN: [id number] DOB: 1940/06/28 Today's Date: 01/26/2014   History of Present Illness  73 year old individual who's had significant back and bilateral lower extremity pain weakness. He has evidence of multiple levels of spondylosis but the singular worst area of stenosis is at L3-L4. He has had progressive weakness of the legs. Underwent decompression at L3-L4 01/25/14. PMH includes malaria, CAD, GERD, prostate cancer, CKD, HTN.   Clinical Impression  Pt mobilizing well after surgery, is benefiting from RW for symmetrical gait pattern, recommend for home. Pt ambulated 400' and ascended 4 stairs at mod I level. Education given for precautions as well as activity progression and posture. No further PT needs at this time.    Follow Up Recommendations No PT follow up    Equipment Recommendations  Rolling walker with 5" wheels    Recommendations for Other Services       Precautions / Restrictions Precautions Precautions: Back Precaution Booklet Issued: Yes (comment) Precaution Comments: pt has brace at home , Dr Ellene Route advised him to wear at d/c. Discussed spinal bracing as well. Required Braces or Orthoses: Spinal Brace Spinal Brace: Lumbar corset;Applied in sitting position Restrictions Weight Bearing Restrictions: No      Mobility  Bed Mobility Overal bed mobility: Modified Independent             General bed mobility comments: pt able to perform bed mobility keeping precautions, education given for technique  Transfers Overall transfer level: Modified independent Equipment used: Rolling walker (2 wheeled)             General transfer comment: vc's for hand placement  Ambulation/Gait Ambulation/Gait assistance: Modified independent (Device/Increase time) Ambulation Distance (Feet): 400 Feet Assistive device: Rolling walker (2 wheeled) Gait Pattern/deviations: WFL(Within Functional Limits) Gait  velocity: WFL Gait velocity interpretation: at or above normal speed for age/gender General Gait Details: pt with symmetrical gait with RW and feels more confident with ambulation, asymmetrical gait without AD, recommend use of RW until pain has decreased to the point that he has symmetrical gait withou AD  Stairs Stairs: Yes Stairs assistance: Modified independent (Device/Increase time) Stair Management: One rail Right;Alternating pattern;Forwards Number of Stairs: 4 General stair comments: able to perform stairs with either leg leading despite feeling weaker on right  Wheelchair Mobility    Modified Rankin (Stroke Patients Only)       Balance Overall balance assessment: Modified Independent                                           Pertinent Vitals/Pain Pain Assessment: Faces Faces Pain Scale: Hurts little more Pain Location: right buttocks Pain Descriptors / Indicators: Aching Pain Intervention(s): Monitored during session;Premedicated before session    Home Living Family/patient expects to be discharged to:: Private residence Living Arrangements: Spouse/significant other Available Help at Discharge: Family;Available PRN/intermittently Type of Home: House Home Access: Stairs to enter Entrance Stairs-Rails: None Entrance Stairs-Number of Steps: 2 Home Layout: One level Home Equipment: Cane - single point      Prior Function Level of Independence: Independent with assistive device(s)         Comments: used cane regularly due to radicular pain     Hand Dominance        Extremity/Trunk Assessment   Upper Extremity Assessment: Overall WFL for tasks assessed  Lower Extremity Assessment: RLE deficits/detail;LLE deficits/detail RLE Deficits / Details: has had radicular pain right buttocks down to right shin, strength grossly 4/5, weakness evident on stairs and with sit to stand without use of hands but sufficient for safe mobility   LLE Deficits / Details: WFL  Cervical / Trunk Assessment: Normal  Communication   Communication: No difficulties  Cognition Arousal/Alertness: Awake/alert Behavior During Therapy: WFL for tasks assessed/performed Overall Cognitive Status: Within Functional Limits for tasks assessed                      General Comments      Exercises Other Exercises Other Exercises: pelvic tilt in supine, 5x      Assessment/Plan    PT Assessment Patent does not need any further PT services  PT Diagnosis     PT Problem List    PT Treatment Interventions     PT Goals (Current goals can be found in the Care Plan section) Acute Rehab PT Goals Patient Stated Goal: return home, golf PT Goal Formulation: No goals set, d/c therapy    Frequency     Barriers to discharge        Co-evaluation               End of Session   Activity Tolerance: Patient tolerated treatment well Patient left: in bed;with call bell/phone within reach Nurse Communication: Mobility status    Functional Assessment Tool Used: clinical judgement' Functional Limitation: Mobility: Walking and moving around Mobility: Walking and Moving Around Current Status ([id number]): At least 1 percent but less than 20 percent impaired, limited or restricted Mobility: Walking and Moving Around Goal Status [phone]): At least 1 percent but less than 20 percent impaired, limited or restricted Mobility: Walking and Moving Around Discharge Status [phone]): At least 1 percent but less than 20 percent impaired, limited or restricted    Time: 0820-0850 PT Time Calculation (min): 30 min   Charges:   PT Evaluation $Initial PT Evaluation Tier I: 1 Procedure PT Treatments $Gait Training: 23-37 mins   PT G Codes:   Functional Assessment Tool Used: clinical judgement' Functional Limitation: Mobility: Walking and moving around  [facility], [place]   Leighton Roach 01/26/2014, 9:19 AM

## 2014-01-26 NOTE — Progress Notes (Signed)
Pt doing well. Pt given D/C instructions with Rx's, verbal understanding was provided. Pt's incision was clean, dry, and had no sign of infection. Pt's IV was removed prior to D/C. Pt D/C'd home via wheelchair @ 0945 per MD order. Pt is stable @ D/C and has no other needs at this time. Holli Humbles, RN

## 2014-01-26 NOTE — Plan of Care (Signed)
Problem: Consults Goal: Diagnosis - Spinal Surgery Outcome: Completed/Met Date Met:  01/25/14 Lumbar Laminectomy (Complex)

## 2014-01-26 NOTE — Discharge Summary (Signed)
Physician Discharge Summary  Patient ID: Wesley Johnston MRN: [id number] DOB/AGE: [birth year] 73 y.o.  Admit date: 01/25/2014 Discharge date: 01/26/2014  Admission Diagnoses: Lumbar stenosis with neurogenic claudication L3-L4, lumbar radiculopathy  Discharge Diagnoses: Lumbar stenosis with neurogenic claudication L3-L4, lumbar radiculopathy Active Problems:   Lumbar stenosis with neurogenic claudication   Discharged Condition: good  Hospital Course: Patient was admitted to undergo surgical decompression arthrodesis. He tolerated the procedure well.  Consults: None  Significant Diagnostic Studies: None  Treatments: surgery: Bilateral laminectomy decompression L3-L4 stabilization with coflex  Discharge Exam: Blood pressure 137/83, pulse 73, temperature 98.4 F (36.9 C), temperature source Oral, resp. rate 18, weight 95.709 kg (211 lb), SpO2 95.00%. Incision is clean and dry, motor function is intact.  Disposition: 01-Home or Self Care  Discharge Instructions   Call MD for:  redness, tenderness, or signs of infection (pain, swelling, redness, odor or green/yellow discharge around incision site)    Complete by:  As directed      Call MD for:  severe uncontrolled pain    Complete by:  As directed      Call MD for:  temperature >100.4    Complete by:  As directed      Diet - low sodium heart healthy    Complete by:  As directed      Discharge instructions    Complete by:  As directed   Okay to shower. Removed outer plastic dressing on Saturday, sooner if it gets wet or saturated underneath. Do not apply salves or appointments to incision. No heavy lifting with the upper extremities greater than 15 pounds. May resume driving when not requiring pain medication and patient feels comfortable with doing so.     Increase activity slowly    Complete by:  As directed             Medication List         amLODipine 5 MG tablet  Commonly known as:  NORVASC  Take 2.5 mg by mouth daily.      aspirin 81 MG EC tablet  Take 81 mg by mouth daily. Swallow whole.     atorvastatin 40 MG tablet  Commonly known as:  LIPITOR  Take 40 mg by mouth daily at 6 PM.     cholecalciferol 1000 UNITS tablet  Commonly known as:  VITAMIN D  Take 2,000 Units by mouth daily.     Vitamin D 2000 UNITS tablet  Take 4,000 Units by mouth daily.     cyanocobalamin 500 MCG tablet  Take 500 mcg by mouth daily.     etodolac 200 MG capsule  Commonly known as:  LODINE  Take 200 mg by mouth 2 (two) times daily.     finasteride 5 MG tablet  Commonly known as:  PROSCAR  Take 5 mg by mouth daily.     HYDROcodone-acetaminophen 5-325 MG per tablet  Commonly known as:  NORCO/VICODIN  Take 1-2 tablets by mouth 3 (three) times daily.     HYDROcodone-acetaminophen 5-325 MG per tablet  Commonly known as:  NORCO/VICODIN  Take 2 tablets by mouth 3 (three) times daily.     ketorolac 10 MG tablet  Commonly known as:  TORADOL  Take 10 mg by mouth 2 (two) times daily.     latanoprost 0.005 % ophthalmic solution  Commonly known as:  XALATAN  Place 1 drop into both eyes at bedtime.     lisinopril 10 MG tablet  Commonly known as:  PRINIVIL,ZESTRIL  Take 10 mg by mouth daily.     methocarbamol 500 MG tablet  Commonly known as:  ROBAXIN  Take 1 tablet (500 mg total) by mouth every 6 (six) hours as needed for muscle spasms.     tamsulosin 0.4 MG Caps capsule  Commonly known as:  FLOMAX  Take 0.4 mg by mouth daily after supper.     temazepam 7.5 MG capsule  Commonly known as:  RESTORIL  Take 7.5 mg by mouth at bedtime as needed for sleep.         SignedEarleen Newport 01/26/2014, 8:40 AM

## 2014-03-30 DIAGNOSIS — Z6827 Body mass index (BMI) 27.0-27.9, adult: Secondary | ICD-10-CM | POA: Diagnosis not present

## 2014-03-30 DIAGNOSIS — M4806 Spinal stenosis, lumbar region: Secondary | ICD-10-CM | POA: Diagnosis not present

## 2014-04-28 ENCOUNTER — Encounter (HOSPITAL_COMMUNITY): Payer: Self-pay | Admitting: Cardiovascular Disease

## 2014-05-24 DIAGNOSIS — C44519 Basal cell carcinoma of skin of other part of trunk: Secondary | ICD-10-CM | POA: Diagnosis not present

## 2014-05-24 DIAGNOSIS — D239 Other benign neoplasm of skin, unspecified: Secondary | ICD-10-CM | POA: Diagnosis not present

## 2014-05-24 DIAGNOSIS — L723 Sebaceous cyst: Secondary | ICD-10-CM | POA: Diagnosis not present

## 2014-07-07 DIAGNOSIS — L720 Epidermal cyst: Secondary | ICD-10-CM | POA: Diagnosis not present

## 2014-11-09 ENCOUNTER — Encounter: Payer: Self-pay | Admitting: *Deleted

## 2014-12-08 ENCOUNTER — Encounter: Payer: Self-pay | Admitting: Cardiovascular Disease

## 2014-12-19 ENCOUNTER — Encounter: Payer: Self-pay | Admitting: Cardiovascular Disease

## 2015-02-15 IMAGING — RF DG C-ARM 61-120 MIN
1 series · 2 of 2 positions shown · non-contrast
Comparison: 01/01/2014

CLINICAL DATA: Coflex implantation.  L3-L4 laminectomy.

EXAM:
DG C-ARM 61-120 MIN; LUMBAR SPINE - 2-3 VIEW

[Series 1: run · 2 of 2 slices shown]
[im 1/2]
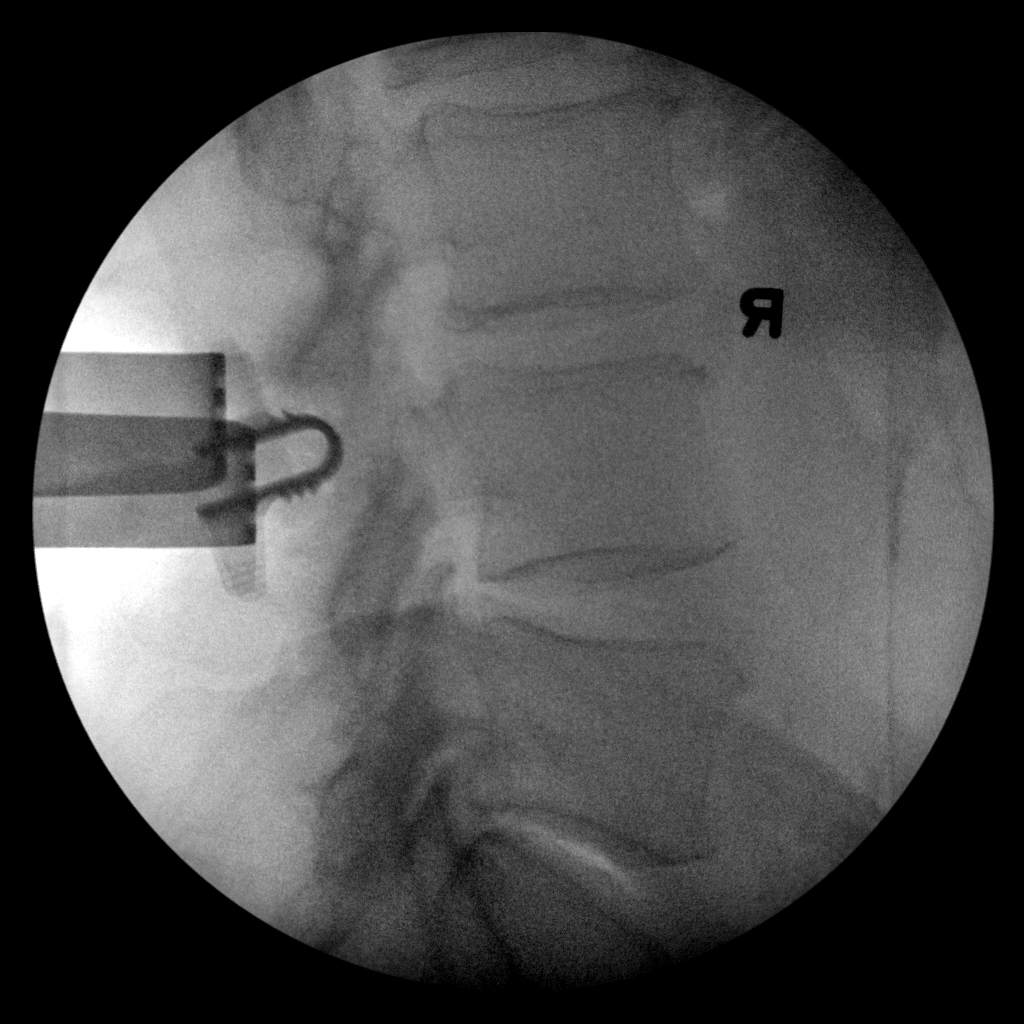
[im 2/2]
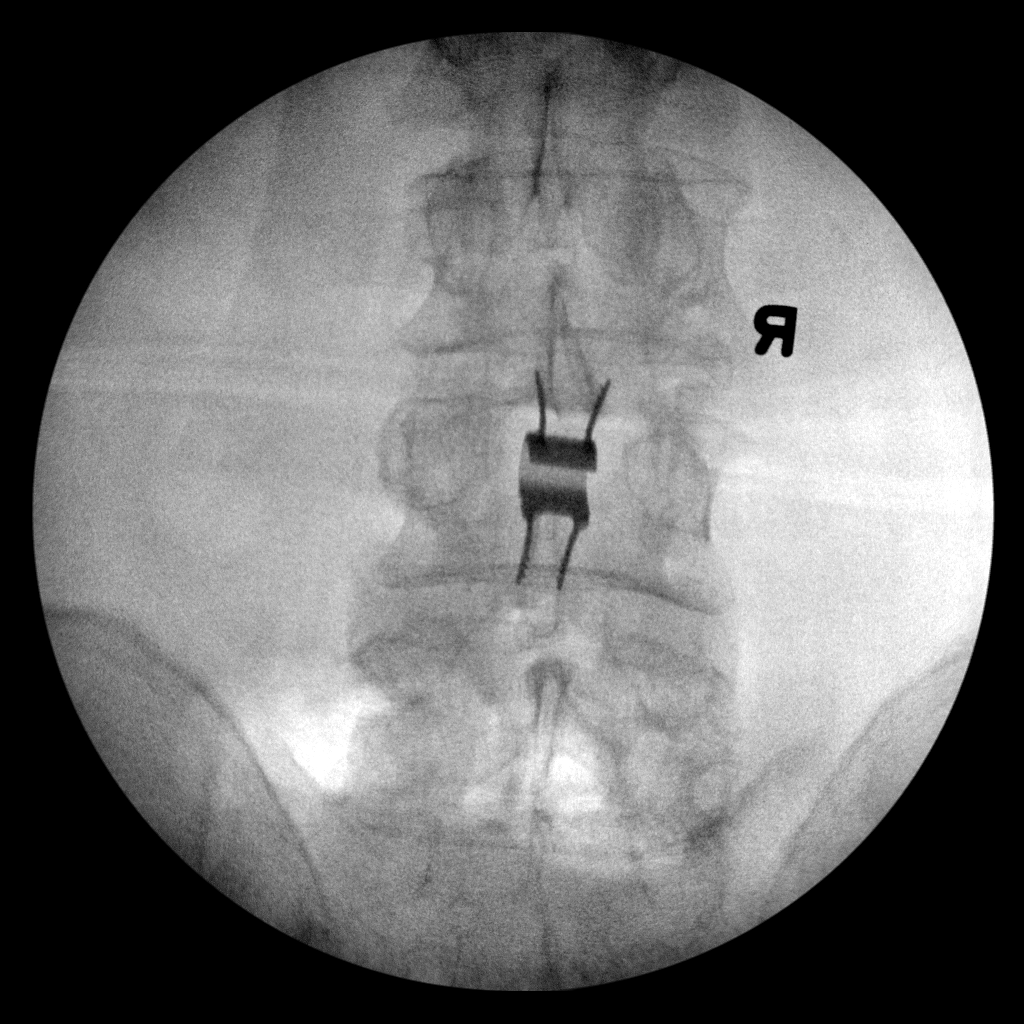

[2 of 2 positions shown; findings below may reference images not displayed]

FINDINGS: Frontal and lateral view of the lumbar spine were obtained. Evidence
for retrolisthesis at L5-S1. Evidence for laminectomy changes at L4
and placement of a Coflex device along the posterior aspect of L4.
IMPRESSION: Placement ofCoflex device.

## 2015-06-01 ENCOUNTER — Telehealth: Payer: Self-pay | Admitting: Cardiovascular Disease

## 2015-06-01 DIAGNOSIS — E785 Hyperlipidemia, unspecified: Secondary | ICD-10-CM

## 2015-06-01 DIAGNOSIS — Z79899 Other long term (current) drug therapy: Secondary | ICD-10-CM

## 2015-06-01 NOTE — Telephone Encounter (Signed)
Pt called in wanting to know if he needs to have any test or labs done prior to coming in for his appt.  Thanks

## 2015-06-01 NOTE — Telephone Encounter (Signed)
Routed to [employer]:  [name] at 06/01/2015 10:58 AM     Status: Signed       Expand All Collapse All   Pt called in wanting to know if he needs to have any test or labs done prior to coming in for his appt.  Thanks

## 2015-06-02 ENCOUNTER — Telehealth: Payer: Self-pay | Admitting: Cardiovascular Disease

## 2015-06-02 NOTE — Telephone Encounter (Signed)
Wesley Johnston is calling because he wants to know if there are any tests that he is needing before his February appt  Can he get those done in [place]

## 2015-06-02 NOTE — Telephone Encounter (Signed)
Contacted patient and informed him labwork has been ordered in advance of appointment. Advised that he has a CMP and fasting lipid test. He requested slips be mailed to him at new addres in .  Confirmed address over the phone. Pt aware I have put slips in outgoing mail.  I have updated pt address.

## 2015-06-02 NOTE — Telephone Encounter (Signed)
Message left for patient to have fasting lab work done at [facility] at his convenience prior to visit in February.  Order placed.

## 2015-06-29 ENCOUNTER — Encounter: Payer: Self-pay | Admitting: Cardiovascular Disease

## 2015-06-29 ENCOUNTER — Ambulatory Visit (INDEPENDENT_AMBULATORY_CARE_PROVIDER_SITE_OTHER): Payer: Medicare Other | Admitting: Cardiovascular Disease

## 2015-06-29 VITALS — BP 126/70 | HR 74 | Ht 73.0 in | Wt 218.7 lb

## 2015-06-29 DIAGNOSIS — I10 Essential (primary) hypertension: Secondary | ICD-10-CM | POA: Diagnosis not present

## 2015-06-29 DIAGNOSIS — E785 Hyperlipidemia, unspecified: Secondary | ICD-10-CM

## 2015-06-29 DIAGNOSIS — I2510 Atherosclerotic heart disease of native coronary artery without angina pectoris: Secondary | ICD-10-CM | POA: Diagnosis not present

## 2015-06-29 MED ORDER — CHLORTHALIDONE 25 MG PO TABS: 25.0000 mg | ORAL_TABLET | Freq: Every day | ORAL | Status: DC

## 2015-06-29 MED ORDER — CHLORTHALIDONE 25 MG PO TABS: 12.5000 mg | ORAL_TABLET | Freq: Every day | ORAL | Status: AC

## 2015-06-29 NOTE — Patient Instructions (Signed)
Your physician has recommended you make the following change in your medication:   STOP AMLODIPINE  START CHLORTHALIDONE 25 MG TAKE 1/2 TABLET DAILY   Dr. Sallyanne Kuster recommends that you schedule a follow-up appointment in: [place]

## 2015-07-01 NOTE — Progress Notes (Signed)
Patient ID: Wesley Johnston, male   DOB: 1940/06/15, 75 y.o.   MRN: [id number]     Cardiology Office Note    Date:  07/01/2015   ID:  Wesley Johnston, DOB 08-31-1940, MRN [id number]  PCP:  Kennon Portela, MD  Cardiologist:   Sanda Klein, MD   Chief Complaint  Patient presents with  . Annual Exam    overdue--last seen 12/10/13//CAD//ptc/o swelling in bilateral legs/ankles/feet    History of Present Illness:  Wesley Johnston is a 75 y.o. male with a history of coronary disease who received a drug-eluting stent the posterior descending artery in 2013 and has not had subsequent coronary events. He remains asymptomatic from a coronary point of view and denies exertional angina, dyspnea, palpitations, syncope or focal neurological events.  He underwent back surgery with a great result and he is able to perform a lot more physical activity. His knees still holding back some. He does not have claudication. He has noticed progressive increase in ankle swelling and describes occasional night sweats. He has primary hyperparathyroidism and is planning to undergo surgery in [place] February 21. He has gained back all the weight that he had lost after his coronary procedure in 2013.    Past Medical History  Diagnosis Date  . Coronary artery disease   . Hypertension   . Anginal pain ([place])   . Exertional dyspnea   . Hyperparathyroidism ([place])     "treated by the [facility] for this"  . Prostate cancer ([place]) 2010  . Malaria 1969  . Pneumonia ~ 2004    "walking pneumonia"  . Glaucoma     bilaterlly  . Dysrhythmia   . Chronic kidney disease     Horse shoe shaped kidney  . GERD (gastroesophageal reflux disease)   . Arthritis     Past Surgical History  Procedure Laterality Date  . Vasectomy  1982  . Prostate biopsy  ~ 2000; ~2005; 2010  . Mole excision      "multiple"  . Mohs surgery  ~ 2002; 2012    right neck; scalp  . Left heart catheterization with coronary angiogram N/A 10/23/2011   Procedure: LEFT HEART CATHETERIZATION WITH CORONARY ANGIOGRAM;  Surgeon: Sanda Klein, MD;  Location: [facility] CATH LAB;  Service: Cardiovascular;  Laterality: N/A;  . Coronary angioplasty with stent placement  10/23/11    "1" DES- RESOLUTE 2.75X12 MM  . Nm myocar multiple w/spect  11/2011    NO REVERSIBLE ISCHEMIA  . Renal duplex  09/2011    NORMAL RENAL DUPLEX    Outpatient Prescriptions Prior to Visit  Medication Sig Dispense Refill  . aspirin 81 MG EC tablet Take 81 mg by mouth daily. Swallow whole.    Marland Kitchen atorvastatin (LIPITOR) 40 MG tablet Take 40 mg by mouth daily at 6 PM.    . Cholecalciferol (VITAMIN D) 2000 UNITS tablet Take 4,000 Units by mouth daily.    . cyanocobalamin 500 MCG tablet Take 500 mcg by mouth daily.    . finasteride (PROSCAR) 5 MG tablet Take 5 mg by mouth daily.    Marland Kitchen HYDROcodone-acetaminophen (NORCO/VICODIN) 5-325 MG per tablet Take 2 tablets by mouth as needed.     . latanoprost (XALATAN) 0.005 % ophthalmic solution Place 1 drop into both eyes at bedtime.    Marland Kitchen lisinopril (PRINIVIL,ZESTRIL) 10 MG tablet Take 10 mg by mouth daily.    . Tamsulosin HCl (FLOMAX) 0.4 MG CAPS Take 0.4 mg by mouth daily after supper.     Marland Kitchen  temazepam (RESTORIL) 7.5 MG capsule Take 7.5 mg by mouth at bedtime as needed for sleep.    Marland Kitchen amLODipine (NORVASC) 5 MG tablet Take 2.5 mg by mouth daily.    . cholecalciferol (VITAMIN D) 1000 UNITS tablet Take 2,000 Units by mouth daily.     Marland Kitchen etodolac (LODINE) 200 MG capsule Take 200 mg by mouth 2 (two) times daily.    Marland Kitchen HYDROcodone-acetaminophen (NORCO/VICODIN) 5-325 MG per tablet Take 1-2 tablets by mouth 3 (three) times daily. 60 tablet 0  . ketorolac (TORADOL) 10 MG tablet Take 10 mg by mouth 2 (two) times daily.    . methocarbamol (ROBAXIN) 500 MG tablet Take 1 tablet (500 mg total) by mouth every 6 (six) hours as needed for muscle spasms. 60 tablet 3   No facility-administered medications prior to visit.     Allergies:   Review of patient's  allergies indicates no known allergies.   Social History   Social History  . Marital Status: Married    Spouse Name: N/A  . Number of Children: N/A  . Years of Education: N/A   Social History Main Topics  . Smoking status: Never Smoker   . Smokeless tobacco: Never Used  . Alcohol Use: 8.4 oz/week    14 Shots of liquor per week  . Drug Use: Yes    Special: Marijuana     Comment: 10/23/11 "quit marijuana more than 35 years ago"  . Sexual Activity: Not Currently   Other Topics Concern  . None   Social History Narrative     Family History:  The patient's family history includes Alzheimer's disease in his mother; Heart attack in his father; Stroke in his maternal grandmother and mother.   ROS:   Please see the history of present illness.    ROS All other systems reviewed and are negative.   PHYSICAL EXAM:   VS:  BP 126/70 mmHg  Pulse 74  Ht 6' 1" (1.854 m)  Wt 99.202 kg (218 lb 11.2 oz)  BMI 28.86 kg/m2   GEN: Well nourished, well developed, in no acute distress HEENT: normal Neck: no JVD, carotid bruits, or masses Cardiac: RRR; no murmurs, rubs, or gallops, bilateral 2+ ankle edema  Respiratory:  clear to auscultation bilaterally, normal work of breathing GI: soft, nontender, nondistended, + BS MS: no deformity or atrophy Skin: warm and dry, no rash Neuro:  Alert and Oriented x 3, Strength and sensation are intact Psych: euthymic mood, full affect  Wt Readings from Last 3 Encounters:  06/29/15 99.202 kg (218 lb 11.2 oz)  01/25/14 95.709 kg (211 lb)  01/20/14 96.072 kg (211 lb 12.8 oz)      Studies/Labs Reviewed:   EKG:  EKG is ordered today.  The ekg ordered today demonstrates NSR, normal QT  Recent Labs: [facility]/16/2015, [facility] Total cholesterol 166, HDL 87, LDL 76, triglycerides 54 Normal liver function tests calcium 10.8, creatinine 0.7, glucose 100 Elevated intact PTH, low phosphorus   ASSESSMENT:    1. Coronary artery disease involving native  coronary artery of native heart without angina pectoris   2. Essential hypertension   3. Dyslipidemia, LDL 170 Oct 2012      PLAN:  In order of problems listed above:  1. No symptoms of coronary insufficiency. Low risk for major cardiovascular complications with parathyroidectomy 2. Excellent blood pressure control. I think is occasional ankle swelling is actually with the goal to amlodipine. It used to bother him enough to justify a change in therapy. Since  he already takes an ACE inhibitor, the best agent to replace amlodipine would be a thiazide diuretic. I recommended that he wait on switching over until after calcium levels stabilize following parathyroid surgery 3. All lipid parameters are in target range and he has a remarkably high HDL cholesterol, which should be protected    Medication Adjustments/Labs and Tests Ordered: Current medicines are reviewed at length with the patient today.  Concerns regarding medicines are outlined above.  Medication changes, Labs and Tests ordered today are listed in the Patient Instructions below. Patient Instructions  Your physician has recommended you make the following change in your medication:   STOP AMLODIPINE  START CHLORTHALIDONE 25 MG TAKE 1/2 TABLET DAILY   Dr. Sallyanne Kuster recommends that you schedule a follow-up appointment in: ONE YEAR         SignedSanda Klein, MD  07/01/2015 12:36 AM    [address] [place], [place], [place]  [address] Phone: [phone]; Fax: [phone]

## 2016-05-29 ENCOUNTER — Telehealth: Payer: Self-pay | Admitting: Cardiovascular Disease

## 2016-05-29 NOTE — Telephone Encounter (Signed)
Okay for Friday follow-up, as long as he follows the instructions you gave him about chest pain lasting more than 20 minutes. Any prolonged chest pain not relieved by 3 sublingual nitroglycerin should lead to immediate emergency room evaluation.

## 2016-05-29 NOTE — Telephone Encounter (Signed)
Pt of Dr. Sallyanne Kuster Re: chest pain  Spoke to wife. Notes symptoms of angina going on about 2 weeks, he's had this before, but she notes it's w more frequency recently. He was taking nitro SL from an old bottle last week, notes it wasn't really helping - he got the medication refilled a few days ago and noted relief immediately w med.  Pt lives in [place] - wants to follow up Friday, really can't be seen sooner anyway due to driving logistics. Wife voiced understanding of my recommendation to see ED evaluation if nitro does not relieve his anginal symptoms after 3 doses or if he has chest pain for >20 minutes. [facility] have [facility] but prefer evaluation here due to better confidence in Dr. Victorino December assessment.  Aware I will let Dr. Sallyanne Kuster be aware of situation. Have advised wife that if Wesley Johnston would like to call to speak to [place] prior to appt, he's welcome to, o/w OK to follow up on Friday.

## 2016-05-29 NOTE — Telephone Encounter (Signed)
Pt advised & voiced understanding of recommendations.

## 2016-05-29 NOTE — Telephone Encounter (Signed)
New message      FYI Not sure if pt needs a callback. He scheduled an appt with Dr Sallyanne Kuster for this fri c/o chest discomfort.

## 2016-05-31 ENCOUNTER — Encounter: Payer: Self-pay | Admitting: Cardiovascular Disease

## 2016-05-31 ENCOUNTER — Ambulatory Visit (INDEPENDENT_AMBULATORY_CARE_PROVIDER_SITE_OTHER): Payer: Medicare Other | Admitting: Cardiovascular Disease

## 2016-05-31 VITALS — BP 141/68 | HR 52 | Ht 73.0 in | Wt 221.6 lb

## 2016-05-31 DIAGNOSIS — I10 Essential (primary) hypertension: Secondary | ICD-10-CM

## 2016-05-31 DIAGNOSIS — E785 Hyperlipidemia, unspecified: Secondary | ICD-10-CM | POA: Diagnosis not present

## 2016-05-31 DIAGNOSIS — I25118 Atherosclerotic heart disease of native coronary artery with other forms of angina pectoris: Secondary | ICD-10-CM

## 2016-05-31 DIAGNOSIS — I209 Angina pectoris, unspecified: Secondary | ICD-10-CM | POA: Diagnosis not present

## 2016-05-31 NOTE — Patient Instructions (Signed)
Your physician recommends that you return for lab work at your convenience - FASTING.  Dr Sallyanne Kuster recommends that you schedule a follow-up appointment in 1 year. You will receive a reminder letter in the mail two months in advance. If you don't receive a letter, please call our office to schedule the follow-up appointment.   If you need a refill on your cardiac medications before your next appointment, please call your pharmacy.

## 2016-05-31 NOTE — Progress Notes (Signed)
Patient ID: Wesley Johnston, male   DOB: July 12, 1940, 76 y.o.   MRN: [id number]     Cardiology Office Note    Date:  05/31/2016   ID:  Wesley Johnston, DOB 1940-07-01, MRN [id number]  PCP:  Pcp Not In System  Cardiologist:   Wesley Klein, MD   Chief Complaint  Patient presents with  . Shortness of Breath    due to exertion.    History of Present Illness:  Wesley Johnston is a 76 y.o. male with a history of coronary disease who received a drug-eluting stent the posterior descending artery in 2013 and has not had subsequent coronary events. He remains asymptomatic from a coronary point of view and denies exertional angina, dyspnea, palpitations, syncope or focal neurological events.  [name] now lives in [place], but came here for recent recurrence of his cardiac symptoms.  He had been symptom free after his stent until the very cold weather in December. He noticed that when taking the dog out he would experience chest tightness similar to his previous angina, if the weather was extremely cold. Sublingual nitroglycerin seems to relieve the symptoms. He was seen at the [facility] and prescribed a low dose of isosorbide mononitrate. He checked a troponin and ECG, both of which were okay. His chest x-ray was normal. Since that time his symptoms have resolved, unclear if due to the warming weather or the isosorbide. He has never been able to take beta blockers due to significant resting bradycardia. He is already on a calcium channel blocker.  Echocardiogram today is completely normal with the exception of sinus bradycardia. Recent labs show a creatinine of 0.78 and hemoglobin of 15.6. He takes a relatively high-dose of statin and his most recent LDL was 76. This was performed at the [place] several months ago.  He underwent uneventful parathyroidectomy for hyperparathyroidism at the [facility] last year. His calcium is now 9.6   Past Medical History:  Diagnosis Date  . Anginal pain ([place])   .  Arthritis   . Chronic kidney disease    Horse shoe shaped kidney  . Coronary artery disease   . Dysrhythmia   . Exertional dyspnea   . GERD (gastroesophageal reflux disease)   . Glaucoma    bilaterlly  . Hyperparathyroidism ([place])    "treated by the [facility] for this"  . Hypertension   . Malaria 1969  . Pneumonia ~ 2004   "walking pneumonia"  . Prostate cancer ([place]) 2010    Past Surgical History:  Procedure Laterality Date  . CORONARY ANGIOPLASTY WITH STENT PLACEMENT  10/23/11   "1" DES- RESOLUTE 2.75X12 MM  . LEFT HEART CATHETERIZATION WITH CORONARY ANGIOGRAM N/A 10/23/2011   Procedure: LEFT HEART CATHETERIZATION WITH CORONARY ANGIOGRAM;  Surgeon: Wesley Klein, MD;  Location: [facility] CATH LAB;  Service: Cardiovascular;  Laterality: N/A;  . MOHS SURGERY  ~ 2002; 2012   right neck; scalp  . mole excision     "multiple"  . NM MYOCAR MULTIPLE W/SPECT  11/2011   NO REVERSIBLE ISCHEMIA  . PROSTATE BIOPSY  ~ 2000; ~2005; 2010  . RENAL DUPLEX  09/2011   NORMAL RENAL DUPLEX  . VASECTOMY  1982    Outpatient Medications Prior to Visit  Medication Sig Dispense Refill  . aspirin 81 MG EC tablet Take 81 mg by mouth daily. Swallow whole.    Marland Kitchen atorvastatin (LIPITOR) 40 MG tablet Take 40 mg by mouth daily at 6 PM.    . chlorthalidone (HYGROTON) 25  MG tablet Take 0.5 tablets (12.5 mg total) by mouth daily. 45 tablet 3  . Cholecalciferol (VITAMIN D) 2000 UNITS tablet Take 4,000 Units by mouth daily.    . cyanocobalamin 500 MCG tablet Take 500 mcg by mouth daily.    . finasteride (PROSCAR) 5 MG tablet Take 5 mg by mouth daily.    Marland Kitchen latanoprost (XALATAN) 0.005 % ophthalmic solution Place 1 drop into both eyes at bedtime.    Marland Kitchen lisinopril (PRINIVIL,ZESTRIL) 10 MG tablet Take 10 mg by mouth daily.    Marland Kitchen omeprazole (PRILOSEC) 20 MG capsule Take 20 mg by mouth as needed.    . Tamsulosin HCl (FLOMAX) 0.4 MG CAPS Take 0.4 mg by mouth daily after supper.     Marland Kitchen HYDROcodone-acetaminophen (NORCO/VICODIN) 5-325 MG  per tablet Take 2 tablets by mouth as needed.     . meloxicam (MOBIC) 7.5 MG tablet Take 7.5 mg by mouth as needed for pain.    Marland Kitchen temazepam (RESTORIL) 7.5 MG capsule Take 7.5 mg by mouth at bedtime as needed for sleep.     No facility-administered medications prior to visit.      Allergies:   Patient has no known allergies.   Social History   Social History  . Marital status: Married    Spouse name: N/A  . Number of children: N/A  . Years of education: N/A   Social History Main Topics  . Smoking status: Never Smoker  . Smokeless tobacco: Never Used  . Alcohol use 8.4 oz/week    14 Shots of liquor per week  . Drug use:     Types: Marijuana     Comment: 10/23/11 "quit marijuana more than 35 years ago"  . Sexual activity: Not Currently   Other Topics Concern  . None   Social History Narrative  . None     Family History:  The patient's family history includes Alzheimer's disease in his mother; Heart attack in his father; Stroke in his maternal grandmother and mother.   ROS:   Please see the history of present illness.    ROS All other systems reviewed and are negative.   PHYSICAL EXAM:   VS:  BP (!) 141/68   Pulse (!) 52   Ht 6\' 1"  (1.854 m)   Wt 100.5 kg (221 lb 9.6 oz)   BMI 29.24 kg/m    GEN: Well nourished, well developed, in no acute distress  HEENT: normal  Neck: no JVD, carotid bruits, or masses Cardiac: RRR; no murmurs, rubs, or gallops, bilateral 2+ ankle edema  Respiratory:  clear to auscultation bilaterally, normal work of breathing GI: soft, nontender, nondistended, + BS MS: no deformity or atrophy  Skin: warm and dry, no rash Neuro:  Alert and Oriented x 3, Strength and sensation are intact Psych: euthymic mood, full affect  Wt Readings from Last 3 Encounters:  05/31/16 100.5 kg (221 lb 9.6 oz)  06/29/15 99.2 kg (218 lb 11.2 oz)  01/25/14 95.7 kg (211 lb)      Studies/Labs Reviewed:   EKG:  EKG is ordered today.  The ekg ordered today  demonstrates NSR, normal QT 411 ms  Recent Labs: 06/16/2015, [facility] Total cholesterol 166, HDL 87, LDL 76, triglycerides 54 Normal liver function tests calcium 10.8, creatinine 0.7, glucose 100 Elevated intact PTH, low phosphorus  05/28/2016 calcium 9.6, creatinine 0.78, glucose 89, undetectable troponin, normal CK-MB, magnesium 2.1, hemoglobin 15.6  ASSESSMENT:    1. Coronary artery disease of native artery of native heart  with stable angina pectoris ([place])   2. Essential hypertension   3. Dyslipidemia      PLAN:  In order of problems listed above:  1. CAD with CCS class 2 stable angina: Resolved after addition of long-acting nitrates and warming of the weather.. Low risk for major cardiovascular events. Will give a trial of medical therapy. Discussed in detail the difference between effort angina and the symptoms of unstable angina. If he has sustained chest discomfort not relieved by rest and sublingual nitroglycerin, lasting more than about 20-30 minutes he should seek immediate emergency medical care. He should also call us if he notices a pattern of acceleration/progression of his exertional symptoms. If this happens, there is really not much room for additional antianginal's (other than Ranexa). Since his symptoms are so typical I would not waste time with a stress test but would proceed directly to coronary angiography.  2. HTN: I rechecked his blood pressure it was 129/62 mmHg Excellent blood pressure control. I think is occasional ankle swelling is actually with the goal to amlodipine. 3. HLP: All lipid parameters areclose to target range and he has a remarkably high HDL cholesterol, which should be protective. Continue statin.    Medication Adjustments/Labs and Tests Ordered: Current medicines are reviewed at length with the patient today.  Concerns regarding medicines are outlined above.  Medication changes, Labs and Tests ordered today are listed in the Patient  Instructions below. Patient Instructions  Your physician recommends that you return for lab work at your [facility].  Dr Sallyanne Kuster recommends that you schedule a follow-up appointment in 1 year. You will receive a reminder letter in the mail two months in advance. If you don't receive a letter, please call our office to schedule the follow-up appointment.   If you need a refill on your cardiac medications before your next appointment, please call your pharmacy.      Signed, Wesley Klein, MD  05/31/2016 5:56 PM    [address], [place], [place]  [address] Phone: [phone]; Fax: [phone]
